# Patient Record
Sex: Male | Born: 1960 | Race: White | Hispanic: No | Marital: Married | Smoking: Never smoker
Health system: Southern US, Community
[De-identification: ages and names within clinical notes are randomized; demographics above are authoritative.]

---

## 2000-05-25 ENCOUNTER — Emergency Department (HOSPITAL_COMMUNITY): Admission: EM | Admit: 2000-05-25 | Discharge: 2000-05-25 | Payer: Self-pay

## 2006-04-22 ENCOUNTER — Encounter: Admission: RE | Admit: 2006-04-22 | Discharge: 2006-04-22 | Payer: Self-pay | Admitting: Orthopedic Surgery

## 2009-10-10 ENCOUNTER — Ambulatory Visit: Payer: Self-pay | Admitting: Professional

## 2009-12-28 ENCOUNTER — Ambulatory Visit: Payer: Self-pay | Admitting: Internal Medicine

## 2009-12-28 DIAGNOSIS — I10 Essential (primary) hypertension: Secondary | ICD-10-CM | POA: Insufficient documentation

## 2010-01-12 ENCOUNTER — Ambulatory Visit: Payer: Self-pay | Admitting: Pulmonary Disease

## 2010-01-12 DIAGNOSIS — R0609 Other forms of dyspnea: Secondary | ICD-10-CM | POA: Insufficient documentation

## 2010-01-12 DIAGNOSIS — R0989 Other specified symptoms and signs involving the circulatory and respiratory systems: Secondary | ICD-10-CM

## 2010-01-16 ENCOUNTER — Ambulatory Visit: Payer: Self-pay | Admitting: Pulmonary Disease

## 2010-01-17 DIAGNOSIS — G4733 Obstructive sleep apnea (adult) (pediatric): Secondary | ICD-10-CM | POA: Insufficient documentation

## 2010-01-24 ENCOUNTER — Ambulatory Visit: Payer: Self-pay | Admitting: Pulmonary Disease

## 2010-06-08 ENCOUNTER — Inpatient Hospital Stay (HOSPITAL_COMMUNITY): Admission: RE | Admit: 2010-06-08 | Discharge: 2010-06-09 | Payer: Self-pay | Admitting: *Deleted

## 2010-11-23 NOTE — Assessment & Plan Note (Signed)
Summary: home study shows AHI of 23/hr.   Copy to:  Dr. Sanda Linger Primary Provider/Referring Provider:  Etta Grandchild MD   History of Present Illness: Pt's apnea link device shows moderate osa with AHI of 23/hr, and desat as low as 81%.  There were no central apneas noted.  Evaluation period was 7hrs and .  I have reviewed the pt's raw data in detail, and agree with the scoring of events.  Allergies: No Known Drug Allergies   Impression & Recommendations:  Problem # 1:  OBSTRUCTIVE SLEEP APNEA (ICD-327.23)  the pt has moderate osa by his type 3 device for home sleep study.  Will arrange for f/u visit to discuss results, and also to arrange treatment.  Orders: Sleep Std Airflow/Heartrate and O2 SAT unattended ([id number])  Patient Instructions: 1)  will arrange ov to discuss sleep study results.

## 2010-11-23 NOTE — Assessment & Plan Note (Signed)
Summary: NEW/ [employer] FED/ [employer]WS  #   Vital Signs:  Patient profile:   50 year old male Height:      72 inches Weight:      194 pounds BMI:     26.41 O2 Sat:      95 % on Room air Temp:     97.0 degrees F oral Pulse rate:   90 / minute Pulse rhythm:   regular Resp:     16 per minute BP sitting:   130 / 82  (left arm) Cuff size:   large  Vitals Entered By: Bill Salinas CMA (December 28, 2009 1:15 PM)  Nutrition Counseling: Patient's BMI is greater than 25 and therefore counseled on weight management options.  O2 Flow:  Room air CC: New pt office visit for evaluation of high bp/ ab   Primary Care Provider:  Etta Grandchild MD  CC:  New pt office visit for evaluation of high bp/ ab.  History of Present Illness:  Hypertension Follow-Up      This is a 50 year old man who presents for Hypertension follow-up.  The patient denies lightheadedness, urinary frequency, headaches, edema, impotence, rash, and fatigue.  The patient denies the following associated symptoms: chest pain, chest pressure, exercise intolerance, dyspnea, palpitations, syncope, leg edema, and pedal edema.  Compliance with medications (by patient report) has been near 100%.  The patient reports that dietary compliance has been excellent.  The patient reports exercising daily.  Adjunctive measures currently used by the patient include salt restriction and relaxation.    His wife is with him today and she reports a 6 month hx. of heavy snoring and apnea spells that last up to 20 seconds.  Preventive Screening-Counseling & Management  Alcohol-Tobacco     Alcohol drinks/day: 0     Smoking Status: never  Caffeine-Diet-Exercise     Does Patient Exercise: no  Hep-HIV-STD-Contraception     Hepatitis Risk: no risk noted     HIV Risk: no risk noted     STD Risk: no risk noted      Sexual History:  currently monogamous.        Drug Use:  no.        Blood Transfusions:  no.    Current Medications (verified): 1)  Diovan  160 Mg Tabs (Valsartan) .Marland Kitchen.. 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   Resp:  Complains of excessive snoring; denies chest discomfort, chest pain with inspiration, cough, coughing up blood, hypersomnolence, morning headaches, pleuritic, shortness of breath, sputum productive, and wheezing.  Physical Exam  General:  alert, well-developed, well-nourished, well-hydrated, appropriate dress, normal appearance, healthy-appearing, and cooperative to examination.   Head:  normocephalic, atraumatic, no abnormalities observed, and no abnormalities palpated.   Eyes:  vision grossly intact, pupils equal, pupils round, and pupils reactive to light.  Mouth:  Oral mucosa and oropharynx without lesions or exudates.  Teeth in good repair. Neck:  supple, full ROM, no masses, no thyromegaly, no thyroid nodules or tenderness, no JVD, normal carotid upstroke, and no carotid bruits.   Lungs:  Normal respiratory effort, chest expands symmetrically. Lungs are clear to auscultation, no crackles or wheezes. Heart:  Normal rate and regular rhythm. S1 and S2 normal without gallop, murmur, click, rub or other extra sounds. Abdomen:  Bowel sounds positive,abdomen soft and non-tender without  masses, organomegaly or hernias noted. Msk:  No deformity or scoliosis noted of thoracic or lumbar spine.   Pulses:  R and L carotid,radial,femoral,dorsalis pedis and posterior tibial pulses are full and equal bilaterally Extremities:  No clubbing, cyanosis, edema, or deformity noted with normal full range of motion of all joints.   Neurologic:  No cranial nerve deficits noted. Station and gait are normal. Plantar reflexes are down-going bilaterally. DTRs are symmetrical throughout. Sensory, motor and coordinative functions appear intact. Skin:  Intact without suspicious lesions or rashes Cervical Nodes:  No lymphadenopathy noted Psych:  Cognition and judgment appear intact. Alert and cooperative with normal attention span and concentration. No apparent delusions, illusions, hallucinations Additional Exam:  EKG is normal.   Impression & Recommendations:  Problem # 1:  HYPERTENSION (ICD-401.9) Assessment Improved  His updated medication list for this problem includes:    Diovan 160 Mg Tabs (Valsartan) .Marland Kitchen... 1 qd  BP today: 130/82  Orders: EKG w/ Interpretation (93000)  Problem # 2:  SLEEP APNEA (ICD-780.57) Assessment: New  Orders: Sleep Disorder Referral (Sleep Disorder) EKG w/ Interpretation (93000)  Complete Medication List: 1)  Diovan 160 Mg Tabs (Valsartan) .Marland Kitchen.. 1 qd  Patient Instructions: 1)  Please schedule a follow-up appointment in 2 months. 2)  It is important that you exercise regularly at least 20 minutes 5 times a week. If you develop chest pain, have severe difficulty breathing, or feel very tired , stop exercising immediately and seek medical attention. 3)  You need to lose weight. Consider a lower calorie diet and regular exercise.  4)  Check your Blood Pressure regularly. If it is above 140/90: you should make an appointment.

## 2010-11-23 NOTE — Assessment & Plan Note (Signed)
Summary: consult for osa   Visit Type:  Initial Consult Copy to:  Dr. Sanda Linger Primary Provider/Referring Provider:  Etta Grandchild MD  CC:  Sleep Consultation for possible OSA.Marland Kitchen  History of Present Illness: the pt is a 50y/o male who I have been asked to see for possible osa.  He has been noted to have loud snoring, pauses in his breathing during sleep, but denies any choking arousals.  He goes to bed around 10pm, and arises at 6am to start his day.  He is unrested about 60% of the time.  He does have variable numbers of awakenings at night.  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  He has no problem staying awake at night watching tv or movie.  He denies any sleepiness driving.  His weight is neutral over the past 2 years, and his epworth score is 3.  Current Medications (verified): 1)  Diovan 160 Mg Tabs (Valsartan) .Marland Kitchen.. 1 Qd  Allergies (verified): No Known Drug Allergies  Past History:  Past Medical History: Reviewed history from 12/28/2009 and no changes required. Hypertension  Past Surgical History: Reviewed history from 12/28/2009 and no changes required. Denies surgical history  Family History: Reviewed history from 12/28/2009 and no changes required. Family History Hypertension Family History Lung cancer  Social History: Reviewed history from 12/28/2009 and no changes required. 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    Vital Signs:  Patient profile:   50 year old male Weight:      196 pounds O2 Sat:      95 % on Room air Temp:     97.9 degrees F oral Pulse rate:   98 / minute BP sitting:   108 / 70  (left arm)  Vitals Entered By: Vernie Murders (January 12, 2010 2:37 PM)  O2 Flow:  Room air  Physical Exam  General:  wd male in nad Eyes:  PERRLA and EOMI.   Nose:  mild septal deviation to right, but patent. Mouth:  mild elongation of soft palate, long uvula Neck:  no jvd, tmg, LN Lungs:  totally clear to auscultation Heart:  rrr, no mrg. Abdomen:  soft and nontender, bs+ Extremities:  no edema noted, no cyanosis pulses intact distally. Neurologic:  alert and oriented, moves all 4.   Impression & Recommendations:  Problem # 1:  SNORING (ICD-786.09) the pt's history is most c/w symptomatic snoring, but I certainly cannot exclude osa.  He has nonrestorative sleep more than 50% of the time, but denies any significant daytime sleepiness issues.  My suspicion is low for clinically signficant sleep apnea, but will do home sleep study to put the issue to rest.  He is agreeable to this approach.  If he does not have osa, I have discussed with him various treatments for snoring including weight loss, positional therapy, surgery, and dental appliance.  Will arrange f/u once sleep study results are available.  Other Orders: Consultation Level  IV [id number]) Misc. Referral (Misc. Ref)  Patient Instructions: 1)  will check apnea link to screen for sleep apnea 2)  work on weight loss, stay off back while sleeping (after you do the apnea link). 3)  will call with results of study.

## 2010-11-23 NOTE — Assessment & Plan Note (Signed)
Summary: rov to review sleep study   Copy to:  Dr. Sanda Linger Primary Provider/Referring Provider:  Etta Grandchild MD  CC:  Follow-up on apnea link..  History of Present Illness: the pt comes in today for f/u after his recent home sleep study.  He was found to have moderate osa, with AHI 23/hr and desat to 81%.  I have gone over the study in depth with the pt and his wife, and answered all of their questions.  Allergies (verified): No Known Drug Allergies  Vital Signs:  Patient profile:   50 year old male Height:      72 inches (182.88 cm) Weight:      198 pounds (90.00 kg) BMI:     26.95 O2 Sat:      96 % on Room air Temp:     98.6 degrees F (37.00 degrees C) oral Pulse rate:   120 / minute BP sitting:   108 / 68  (left arm) Cuff size:   regular  Vitals Entered By: Michel Bickers CMA (January 24, 2010 12:14 PM)  O2 Sat at Rest %:  96 O2 Flow:  Room air  Physical Exam  General:  wd male in nad   Impression & Recommendations:  Problem # 1:  OBSTRUCTIVE SLEEP APNEA (ICD-327.23) the pt has moderate osa with definite impact on QOL.  I have reviewed the various treatment options, including modest weight loss, ua surgery, dental appliance, and cpap.  We have discussed the advantages and disadvantages of the different treatments, and he would like to take some time to think about this.  Time spent with pt today was .  Other Orders: Est. Patient Level III (16109)  Patient Instructions: 1)  think about various treatment options we discussed.  Please call me.

## 2011-01-05 LAB — ABO/RH: ABO/RH(D): O POS

## 2011-01-05 LAB — CBC
HCT: 46.1 % (ref 39.0–52.0)
Hemoglobin: 16.3 g/dL (ref 13.0–17.0)
MCH: 31.8 pg (ref 26.0–34.0)
MCHC: 35.4 g/dL (ref 30.0–36.0)
Platelets: 232 10*3/uL (ref 150–400)
RDW: 12.2 % (ref 11.5–15.5)

## 2011-01-05 LAB — COMPREHENSIVE METABOLIC PANEL
AST: 29 U/L (ref 0–37)
Albumin: 4.3 g/dL (ref 3.5–5.2)
Alkaline Phosphatase: 62 U/L (ref 39–117)
BUN: 12 mg/dL (ref 6–23)
CO2: 27 mEq/L (ref 19–32)
Calcium: 9.8 mg/dL (ref 8.4–10.5)
Chloride: 105 mEq/L (ref 96–112)
GFR calc Af Amer: >60 mL/min (ref >60)
GFR calc non Af Amer: >60 mL/min (ref >60)
Potassium: 4.2 mEq/L (ref 3.5–5.1)
Sodium: 141 mEq/L (ref 135–145)
Total Bilirubin: 2.2 mg/dL — ABNORMAL HIGH (ref 0.3–1.2)
Total Protein: 7.4 g/dL (ref 6.0–8.3)

## 2011-01-05 LAB — DIFFERENTIAL
Basophils Absolute: 0 10*3/uL (ref 0.0–0.1)
Lymphs Abs: 1.5 10*3/uL (ref 0.7–4.0)
Neutro Abs: 5.3 10*3/uL (ref 1.7–7.7)

## 2011-01-05 LAB — URINALYSIS, ROUTINE W REFLEX MICROSCOPIC
Bilirubin Urine: NEGATIVE
Nitrite: NEGATIVE
Protein, ur: NEGATIVE mg/dL
Urobilinogen, UA: 0.2 mg/dL (ref 0.0–1.0)

## 2011-01-05 LAB — TYPE AND SCREEN: Antibody Screen: NEGATIVE

## 2011-01-05 LAB — PROTIME-INR: INR: 0.93 (ref 0.00–1.49)

## 2011-03-10 IMAGING — RF DG CERVICAL SPINE 2 OR 3 VIEWS
1 series · 2 of 2 positions shown · non-contrast
Comparison: None.

Addendum Begins

Addendum Ends
CLINICAL DATA: C5-C7 ACDF.
Fluoroscopy time:  8 seconds.
CERVICAL SPINE - 2-3 VIEW

[Series 1: run · 2 of 2 slices shown]
[im 1/2]
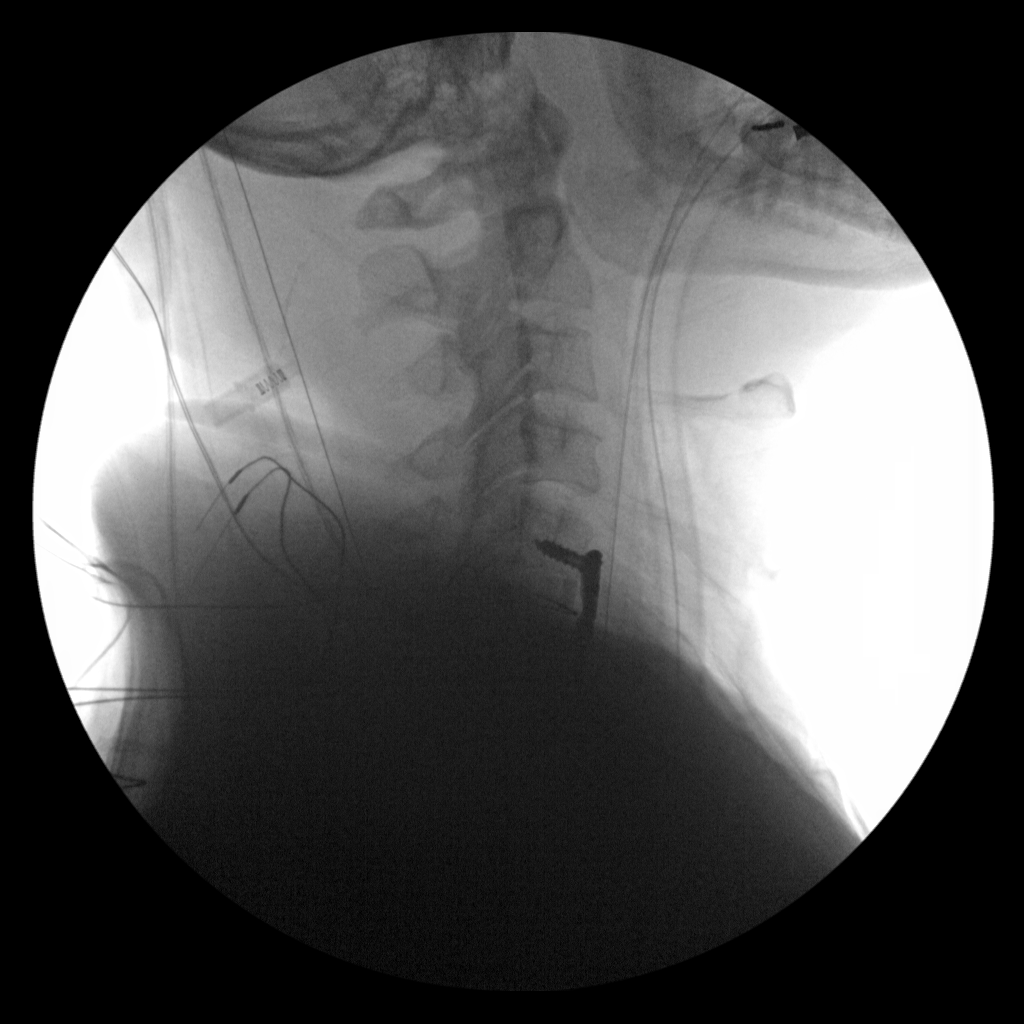
[im 2/2]
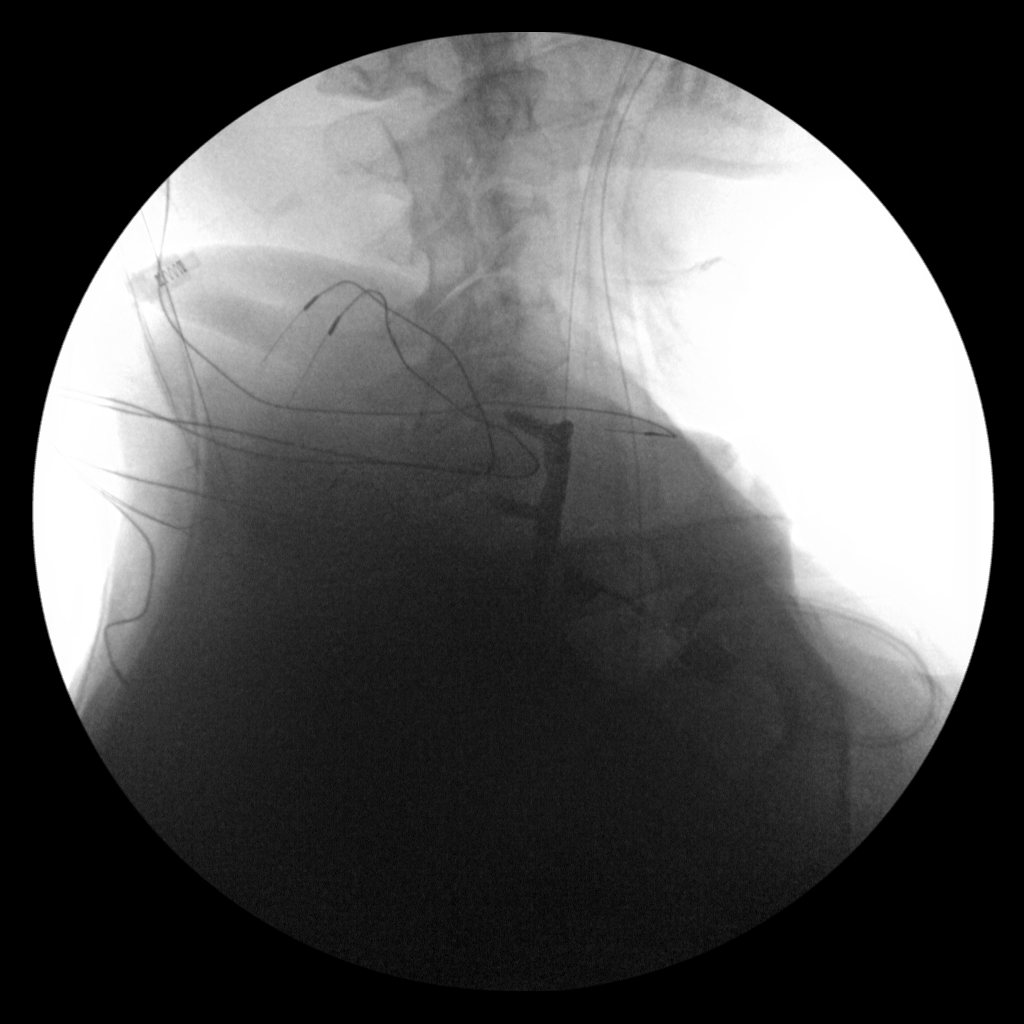

[2 of 2 positions shown; findings below may reference images not displayed]

FINDINGS: Shoulder soft tissues obscure the C6-7.  More superior
anterior cervical discectomy fusion hardware and screws with
interbody bone plug C5-6 appears normal.
IMPRESSION: 1.  Visualized portion C5-6 portion of ACDF appears normal.
2.  C6-7 obscured due to shoulder soft tissues.

## 2012-05-02 ENCOUNTER — Ambulatory Visit (INDEPENDENT_AMBULATORY_CARE_PROVIDER_SITE_OTHER): Payer: BC Managed Care – PPO | Admitting: Emergency Medicine

## 2012-05-02 VITALS — BP 138/90 | HR 104 | Temp 98.0°F | Resp 16 | Ht 70.58 in | Wt 196.2 lb

## 2012-05-02 DIAGNOSIS — Z202 Contact with and (suspected) exposure to infections with a predominantly sexual mode of transmission: Secondary | ICD-10-CM

## 2012-05-02 DIAGNOSIS — R300 Dysuria: Secondary | ICD-10-CM

## 2012-05-02 DIAGNOSIS — Z2089 Contact with and (suspected) exposure to other communicable diseases: Secondary | ICD-10-CM

## 2012-05-02 LAB — POCT URINALYSIS DIPSTICK
Bilirubin, UA: NEGATIVE
Blood, UA: NEGATIVE
Glucose, UA: NEGATIVE
Ketones, UA: NEGATIVE
Leukocytes, UA: NEGATIVE
Nitrite, UA: NEGATIVE
Protein, UA: NEGATIVE
Spec Grav, UA: 1.015
Urobilinogen, UA: 0.2
pH, UA: 6.5

## 2012-05-02 LAB — POCT UA - MICROSCOPIC ONLY
Bacteria, U Microscopic: NEGATIVE
Casts, Ur, LPF, POC: NEGATIVE
Epithelial cells, urine per micros: NEGATIVE
Mucus, UA: NEGATIVE
RBC, urine, microscopic: NEGATIVE
Yeast, UA: NEGATIVE

## 2012-05-02 MED ORDER — DOXYCYCLINE HYCLATE 100 MG PO CAPS: 100.0000 mg | ORAL_CAPSULE | Freq: Two times a day (BID) | ORAL | Status: AC

## 2012-05-02 MED ORDER — CEFTRIAXONE SODIUM 1 G IJ SOLR
250.0000 mg | INTRAMUSCULAR | Status: DC
  Administered 2012-05-02: 250 mg via INTRAMUSCULAR

## 2012-05-02 NOTE — Patient Instructions (Signed)
Sexually Transmitted Disease Sexually transmitted disease (STD) refers to any infection that is passed from person to person during sexual activity. This may happen by way of saliva, semen, blood, vaginal mucus, or urine. Common STDs include:  Gonorrhea.   Chlamydia.   Syphilis.   HIV/AIDS.   Genital herpes.   Hepatitis B and C.   Trichomonas.   Human papillomavirus (HPV).   Pubic lice.  CAUSES  An STD may be spread by bacteria, virus, or parasite. A person can get an STD by:  Sexual intercourse with an infected person.   Sharing sex toys with an infected person.   Sharing needles with an infected person.   Having intimate contact with the genitals, mouth, or rectal areas of an infected person.  SYMPTOMS  Some people may not have any symptoms, but they can still pass the infection to others. Different STDs have different symptoms. Symptoms include:  Painful or bloody urination.   Pain in the pelvis, abdomen, vagina, anus, throat, or eyes.   Skin rash, itching, irritation, growths, or sores (lesions). These usually occur in the genital or anal area.   Abnormal vaginal discharge.   Penile discharge in men.   Soft, flesh-colored skin growths in the genital or anal area.   Fever.   Pain or bleeding during sexual intercourse.   Swollen glands in the groin area.   Yellow skin and eyes (jaundice). This is seen with hepatitis.  DIAGNOSIS  To make a diagnosis, your caregiver may:  Take a medical history.   Perform a physical exam.   Take a specimen (culture) to be examined.   Examine a sample of discharge under a microscope.   Perform blood tests.   Perform a Pap test, if this applies.   Perform a colposcopy.   Perform a laparoscopy.  TREATMENT   Chlamydia, gonorrhea, trichomonas, and syphilis can be cured with antibiotic medicine.   Genital herpes, hepatitis, and HIV can be treated, but not cured, with prescribed medicines. The medicines will lessen  the symptoms.   Genital warts from HPV can be treated with medicine or by freezing, burning (electrocautery), or surgery. Warts may come back.   HPV is a virus and cannot be cured with medicine or surgery.However, abnormal areas may be followed very closely by your caregiver and may be removed from the cervix, vagina, or vulva through office procedures or surgery.  If your diagnosis is confirmed, your recent sexual partners need treatment. This is true even if they are symptom-free or have a negative culture or evaluation. They should not have sex until their caregiver says it is okay. HOME CARE INSTRUCTIONS  All sexual partners should be informed, tested, and treated for all STDs.   Take your antibiotics as directed. Finish them even if you start to feel better.   Only take over-the-counter or prescription medicines for pain, discomfort, or fever as directed by your caregiver.   Rest.   Eat a balanced diet and drink enough fluids to keep your urine clear or pale yellow.   Do not have sex until treatment is completed and you have followed up with your caregiver. STDs should be checked after treatment.   Keep all follow-up appointments, Pap tests, and blood tests as directed by your caregiver.   Only use latex condoms and water-soluble lubricants during sexual activity. Do not use petroleum jelly or oils.   Avoid alcohol and illegal drugs.   Get vaccinated for HPV and hepatitis. If you have not received these vaccines   in the past, talk to your caregiver about whether one or both might be right for you.   Avoid risky sex practices that can break the skin.  The only way to avoid getting an STD is to avoid all sexual activity.Latex condoms and dental dams (for oral sex) will help lessen the risk of getting an STD, but will not completely eliminate the risk. SEEK MEDICAL CARE IF:   You have a fever.   You have any new or worsening symptoms.  Document Released: 12/29/2002 Document  Revised: 09/27/2011 Document Reviewed: 01/05/2011 [facility] Patient Information 2012 [place], [place].

## 2012-05-02 NOTE — Progress Notes (Signed)
  Subjective:    Patient ID: Jason Harms., male    DOB: 27-Jul-1961, 51 y.o.   MRN: [id number]  HPI Comments: Had unprotected sexual encounter  Dysuria  This is a new problem. The current episode started in the past 7 days. The problem occurs every urination. The problem has been unchanged. The quality of the pain is described as burning. The pain is mild. There has been no fever. He is sexually active. There is no history of pyelonephritis. Associated symptoms include a discharge. Pertinent negatives include no chills, flank pain, frequency, hematuria, hesitancy, nausea, possible pregnancy, sweats, urgency or vomiting. He has tried nothing for the symptoms. The treatment provided no relief. There is no history of catheterization, kidney stones, recurrent UTIs, a single kidney, urinary stasis or a urological procedure.      Review of Systems  Constitutional: Negative for chills.  Gastrointestinal: Negative for nausea and vomiting.  Genitourinary: Positive for dysuria. Negative for hesitancy, urgency, frequency, hematuria and flank pain.  All other systems reviewed and are negative.       Objective:   Physical Exam  Constitutional: He is oriented to person, place, and time. He appears well-developed and well-nourished. No distress.  HENT:  Head: Normocephalic and atraumatic.  Right Ear: External ear normal.  Eyes: Conjunctivae are normal. Pupils are equal, round, and reactive to light. No scleral icterus.  Neck: Normal range of motion.  Cardiovascular: Normal rate.   Pulmonary/Chest: Effort normal.  Genitourinary: Penis normal. No penile tenderness.  Musculoskeletal: Normal range of motion.  Neurological: He is alert and oriented to person, place, and time.  Skin: Skin is warm and dry.          Assessment & Plan:  Exposure to STD genprobe Rocephin Doxy

## 2012-05-03 LAB — GC/CHLAMYDIA PROBE AMP, GENITAL: GC Probe Amp, Genital: NEGATIVE

## 2015-07-19 ENCOUNTER — Emergency Department (INDEPENDENT_AMBULATORY_CARE_PROVIDER_SITE_OTHER)
Admission: EM | Admit: 2015-07-19 | Discharge: 2015-07-19 | Disposition: A | Discharge status: Home / Self Care | Payer: Worker's Compensation | Source: Home / Self Care | Attending: Emergency Medicine | Admitting: Emergency Medicine

## 2015-07-19 ENCOUNTER — Emergency Department (INDEPENDENT_AMBULATORY_CARE_PROVIDER_SITE_OTHER): Payer: Worker's Compensation

## 2015-07-19 ENCOUNTER — Encounter (HOSPITAL_COMMUNITY): Payer: Self-pay | Admitting: Emergency Medicine

## 2015-07-19 DIAGNOSIS — S161XXA Strain of muscle, fascia and tendon at neck level, initial encounter: Secondary | ICD-10-CM

## 2015-07-19 DIAGNOSIS — S39012A Strain of muscle, fascia and tendon of lower back, initial encounter: Secondary | ICD-10-CM

## 2015-07-19 DIAGNOSIS — T148 Other injury of unspecified body region: Secondary | ICD-10-CM

## 2015-07-19 DIAGNOSIS — T148XXA Other injury of unspecified body region, initial encounter: Secondary | ICD-10-CM

## 2015-07-19 MED ORDER — DICLOFENAC SODIUM 1 % TD GEL: 1.0000 "application " | Freq: Four times a day (QID) | TRANSDERMAL | Status: DC

## 2015-07-19 MED ORDER — NAPROXEN 375 MG PO TABS: 375.0000 mg | ORAL_TABLET | Freq: Two times a day (BID) | ORAL | Status: DC

## 2015-07-19 MED ORDER — CYCLOBENZAPRINE HCL 5 MG PO TABS: ORAL_TABLET | ORAL | Status: DC
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
## 2015-07-19 NOTE — Discharge Instructions (Signed)
Back Pain, Adult °Low back pain is very common. About 1 in 5 people have back pain. The cause of low back pain is rarely dangerous. The pain often gets better over time. About half of people with a sudden onset of back pain feel better in just 2 weeks. About 8 in 10 people feel better by 6 weeks.  °CAUSES °Some common causes of back pain include: °· Strain of the muscles or ligaments supporting the spine. °· Wear and tear (degeneration) of the spinal discs. °· Arthritis. °· Direct injury to the back. °DIAGNOSIS °Most of the time, the direct cause of low back pain is not known. However, back pain can be treated effectively even when the exact cause of the pain is unknown. Answering your caregiver's questions about your overall health and symptoms is one of the most accurate ways to make sure the cause of your pain is not dangerous. If your caregiver needs more information, he or she may order lab work or imaging tests (X-rays or MRIs). However, even if imaging tests show changes in your back, this usually does not require surgery. °HOME CARE INSTRUCTIONS °For many people, back pain returns. Since low back pain is rarely dangerous, it is often a condition that people can learn to manage on their own.  °· Remain active. It is stressful on the back to sit or stand in one place. Do not sit, drive, or stand in one place for more than 30 minutes at a time. Take short walks on level surfaces as soon as pain allows. Try to increase the length of time you walk each day. °· Do not stay in bed. Resting more than 1 or 2 days can delay your recovery. °· Do not avoid exercise or work. Your body is made to move. It is not dangerous to be active, even though your back may hurt. Your back will likely heal faster if you return to being active before your pain is gone. °· Pay attention to your body when you  bend and lift. Many people have less discomfort when lifting if they bend their knees, keep the load close to their bodies, and  avoid twisting. Often, the most comfortable positions are those that put less stress on your recovering back. °· Find a comfortable position to sleep. Use a firm mattress and lie on your side with your knees slightly bent. If you lie on your back, put a pillow under your knees. °· Only take over-the-counter or prescription medicines as directed by your caregiver. Over-the-counter medicines to reduce pain and inflammation are often the most helpful. Your caregiver may prescribe muscle relaxant drugs. These medicines help dull your pain so you can more quickly return to your normal activities and healthy exercise. °· Put ice on the injured area. °¨ Put ice in a plastic bag. °¨ Place a towel between your skin and the bag. °¨ Leave the ice on for 15-20 minutes, 03-04 times a day for the first 2 to 3 days. After that, ice and heat may be alternated to reduce pain and spasms. °· Ask your caregiver about trying back exercises and gentle massage. This may be of some benefit. °· Avoid feeling anxious or stressed. Stress increases muscle tension and can worsen back pain. It is important to recognize when you are anxious or stressed and learn ways to manage it. Exercise is a great option. °SEEK MEDICAL CARE IF: °· You have pain that is not relieved with rest or medicine. °· You have pain that does not improve in 1 week. °· You have new symptoms. °· You are generally not feeling well. °SEEK   IMMEDIATE MEDICAL CARE IF:  °· You have pain that radiates from your back into your legs. °· You develop new bowel or bladder control problems. °· You have unusual weakness or numbness in your arms or legs. °· You develop nausea or vomiting. °· You develop abdominal pain. °· You feel faint. °Document Released: 10/08/2005 Document Revised: 04/08/2012 Document Reviewed: 02/09/2014 °[employer]® Patient Information ©2015 [employer]. This information is not intended to replace advice given to you by your health care provider. Make sure you  discuss any questions you have with your health care provider. ° °Lumbosacral Strain °Lumbosacral strain is a strain of any of the parts that make up your lumbosacral vertebrae. Your lumbosacral vertebrae are the bones that make up the lower third of your backbone. Your lumbosacral vertebrae are held together by muscles and tough, fibrous tissue (ligaments).  °CAUSES  °A sudden blow to your back can cause lumbosacral strain. Also, anything that causes an excessive stretch of the muscles in the low back can cause this strain. This is typically seen when people exert themselves strenuously, fall, lift heavy objects, bend, or crouch repeatedly. °RISK FACTORS °· Physically demanding work. °· Participation in pushing or pulling sports or sports that require a sudden twist of the back (tennis, golf, baseball). °· Weight lifting. °· Excessive lower back curvature. °· Forward-tilted pelvis. °· Weak back or abdominal muscles or both. °· Tight hamstrings. °SIGNS AND SYMPTOMS  °Lumbosacral strain may cause pain in the area of your injury or pain that moves (radiates) down your leg.  °DIAGNOSIS °Your health care provider can often diagnose lumbosacral strain through a physical exam. In some cases, you may need tests such as X-ray exams.  °TREATMENT  °Treatment for your lower back injury depends on many factors that your clinician will have to evaluate. However, most treatment will include the use of anti-inflammatory medicines. °HOME CARE INSTRUCTIONS  °· Avoid hard physical activities (tennis, racquetball, waterskiing) if you are not in proper physical condition for it. This may aggravate or create problems. °· If you have a back problem, avoid sports requiring sudden body movements. Swimming and walking are generally safer activities. °· Maintain good posture. °· Maintain a healthy weight. °· For acute conditions, you may put ice on the injured area. °¨ Put ice in a plastic bag. °¨ Place a towel between your skin and the  bag. °¨ Leave the ice on for 20 minutes, 2-3 times a day. °· When the low back starts healing, stretching and strengthening exercises may be recommended. °SEEK MEDICAL CARE IF: °· Your back pain is getting worse. °· You experience severe back pain not relieved with medicines. °SEEK IMMEDIATE MEDICAL CARE IF:  °· You have numbness, tingling, weakness, or problems with the use of your arms or legs. °· There is a change in bowel or bladder control. °· You have increasing pain in any area of the body, including your belly (abdomen). °· You notice shortness of breath, dizziness, or feel faint. °· You feel sick to your stomach (nauseous), are throwing up (vomiting), or become sweaty. °· You notice discoloration of your toes or legs, or your feet get very cold. °MAKE SURE YOU:  °· Understand these instructions. °· Will watch your condition. °· Will get help right away if you are not doing well or get worse. °Document Released: 07/18/2005 Document Revised: 10/13/2013 Document Reviewed: 05/27/2013 °ExitCare® Patient Information ©2015 [employer]. This information is not intended to replace advice given to you by your health care provider.   Make sure you discuss any questions you have with your health care provider. ° °Muscle Strain °A muscle strain is an injury that occurs when a muscle is stretched beyond its normal length. Usually a small number of muscle fibers are torn when this happens. Muscle strain is rated in degrees. First-degree strains have the least amount of muscle fiber tearing and pain. Second-degree and third-degree strains have increasingly more tearing and pain.  °Usually, recovery from muscle strain takes 1-2 weeks. Complete healing takes 5-6 weeks.  °CAUSES  °Muscle strain happens when a sudden, violent force placed on a muscle stretches it too far. This may occur with lifting, sports, or a fall.  °RISK FACTORS °Muscle strain is especially common in athletes.  °SIGNS AND SYMPTOMS °At the site of the  muscle strain, there may be: °· Pain. °· Bruising. °· Swelling. °· Difficulty using the muscle due to pain or lack of normal function. °DIAGNOSIS  °Your health care provider will perform a physical exam and ask about your medical history. °TREATMENT  °Often, the best treatment for a muscle strain is resting, icing, and applying cold compresses to the injured area.   °HOME CARE INSTRUCTIONS  °· Use the PRICE method of treatment to promote muscle healing during the first 2-3 days after your injury. The PRICE method involves: °¨ Protecting the muscle from being injured again. °¨ Restricting your activity and resting the injured body part. °¨ Icing your injury. To do this, put ice in a plastic bag. Place a towel between your skin and the bag. Then, apply the ice and leave it on from 15-20 minutes each hour. After the third day, switch to moist heat packs. °¨ Apply compression to the injured area with a splint or elastic bandage. Be careful not to wrap it too tightly. This may interfere with blood circulation or increase swelling. °¨ Elevate the injured body part above the level of your heart as often as you can. °· Only take over-the-counter or prescription medicines for pain, discomfort, or fever as directed by your health care provider. °· Warming up prior to exercise helps to prevent future muscle strains. °SEEK MEDICAL CARE IF:  °· You have increasing pain or swelling in the injured area. °· You have numbness, tingling, or a significant loss of strength in the injured area. °MAKE SURE YOU:  °· Understand these instructions. °· Will watch your condition. °· Will get help right away if you are not doing well or get worse. °Document Released: 10/08/2005 Document Revised: 07/29/2013 Document Reviewed: 05/07/2013 °ExitCare® Patient Information ©2015 [employer]. This information is not intended to replace advice given to you by your health care provider. Make sure you discuss any questions you have with your health  care provider. ° °

## 2022-04-22 ENCOUNTER — Emergency Department (HOSPITAL_BASED_OUTPATIENT_CLINIC_OR_DEPARTMENT_OTHER)
Admission: EM | Admit: 2022-04-22 | Discharge: 2022-04-22 | Disposition: A | Discharge status: Home / Self Care | Payer: Federal, State, Local not specified - PPO | Attending: Emergency Medicine | Admitting: Emergency Medicine

## 2022-04-22 ENCOUNTER — Emergency Department (HOSPITAL_BASED_OUTPATIENT_CLINIC_OR_DEPARTMENT_OTHER): Payer: Federal, State, Local not specified - PPO

## 2022-04-22 ENCOUNTER — Other Ambulatory Visit: Payer: Self-pay

## 2022-04-22 ENCOUNTER — Encounter (HOSPITAL_BASED_OUTPATIENT_CLINIC_OR_DEPARTMENT_OTHER): Payer: Self-pay | Admitting: Emergency Medicine

## 2022-04-22 DIAGNOSIS — M79671 Pain in right foot: Secondary | ICD-10-CM | POA: Diagnosis present

## 2022-04-22 DIAGNOSIS — Z7982 Long term (current) use of aspirin: Secondary | ICD-10-CM | POA: Insufficient documentation

## 2022-04-22 MED ORDER — MELOXICAM 7.5 MG PO TABS: 7.5000 mg | ORAL_TABLET | Freq: Every day | ORAL | 0 refills | Status: DC

## 2022-04-22 MED ORDER — DICLOFENAC SODIUM 1 % TD GEL: 2.0000 g | Freq: Four times a day (QID) | TRANSDERMAL | 0 refills | Status: AC

## 2022-04-22 NOTE — ED Provider Notes (Signed)
[facility] EMERGENCY DEPARTMENT Provider Note   CSN: [id number] Arrival date & time: 04/22/22  6387     History  Chief Complaint  Patient presents with   Foot Pain    Jason Shepard. is a 61 y.o. male.  Patient is a 61 year old male who presents with pain in his right foot.  He says been hurting about 4 days.  It hurts more when he walks on it.  He denies any known injury.  No change in shoes or increased activity.  No prior history of joint issues.  He has been intermittently taking some aspirin and ibuprofen with no significant improvement in symptoms.  He has not noticed any swelling.       Home Medications Prior to Admission medications   Medication Sig Start Date End Date Taking? Authorizing Provider  meloxicam (MOBIC) 7.5 MG tablet Take 1 tablet (7.5 mg total) by mouth daily. 04/22/22  Yes Rolan Bucco, MD  cyclobenzaprine (FLEXERIL) 5 MG tablet One tablet every 8 hours when at home for muscle spasms. Will cause drowsiness 07/19/15   Hayden Rasmussen, NP  diclofenac sodium (VOLTAREN) 1 % GEL Apply 2 g topically 4 (four) times daily. 04/22/22   Rolan Bucco, MD      Allergies    Patient has no known allergies.    Review of Systems   Review of Systems  Constitutional:  Negative for fever.  Gastrointestinal:  Negative for nausea and vomiting.  Musculoskeletal:  Positive for arthralgias. Negative for back pain, joint swelling and neck pain.  Skin:  Negative for wound.  Neurological:  Negative for weakness, numbness and headaches.    Physical Exam Updated Vital Signs BP (!) 156/103 (BP Location: Left Arm)   Pulse 88   Temp 98.5 F (36.9 C) (Oral)   Resp 18   Ht 5\' 11"  (1.803 m)   Wt 90.7 kg   SpO2 94%   BMI 27.89 kg/m  Physical Exam Constitutional:      Appearance: He is well-developed.  HENT:     Head: Normocephalic and atraumatic.  Cardiovascular:     Rate and Rhythm: Normal rate.  Pulmonary:     Effort: Pulmonary effort is normal.   Musculoskeletal:        General: Tenderness present.     Cervical back: Normal range of motion and neck supple.     Comments: Patient has some tenderness over the midfoot area.  There is no swelling or deformity.  No warmth or erythema.  No wounds.  Pedal pulses are intact.  No pain to the ankle.  He has normal sensation and motor function distally.  Skin:    General: Skin is warm and dry.  Neurological:     Mental Status: He is alert and oriented to person, place, and time.     ED Results / Procedures / Treatments   Labs (all labs ordered are listed, but only abnormal results are displayed) Labs Reviewed - No data to display  EKG None  Radiology DG Foot Complete Right  Result Date: 04/22/2022 CLINICAL DATA:  RIGHT foot pain for 4 days, denies injury EXAM: RIGHT FOOT COMPLETE - 3+ VIEW COMPARISON:  None FINDINGS: Osseous mineralization low normal. Joint spaces preserved. No acute fracture or dislocation. Questionable erosion at the lateral margin of the fifth metatarsal head. Small plantar calcaneal spur. Small calcified bodies posterior to the ankle joint. IMPRESSION: Questionable erosion at fifth metatarsal head, cannot exclude an inflammatory arthropathy. Small plantar calcaneal spur. No acute  fracture or dislocation. Electronically Signed   By: Ulyses Southward M.D.   On: 04/22/2022 10:19    Procedures Procedures    Medications Ordered in ED Medications - No data to display  ED Course/ Medical Decision Making/ A&P                           Medical Decision Making Amount and/or Complexity of Data Reviewed Radiology: ordered.  Risk Prescription drug management.   Patient is a 61 year old who presents with pain in his right foot.  There is no visible swelling of the foot.  No signs of infection.  No joint swelling which would be more concerning for etiology such as gout or septic joint.  X-rays were performed which shows no evidence of fracture.  This was interpreted by me.   There is possible erosive changes in the fifth metatarsal.  This was confirmed by radiology.  Patient is otherwise well-appearing.  We will refer him to podiatry.  We will give him a prescription for Mobic and Voltaren gel.  Return precautions were given.  Final Clinical Impression(s) / ED Diagnoses Final diagnoses:  Foot pain, right    Rx / DC Orders ED Discharge Orders          Ordered    diclofenac sodium (VOLTAREN) 1 % GEL  4 times daily        04/22/22 1035    meloxicam (MOBIC) 7.5 MG tablet  Daily        04/22/22 1035              Rolan Bucco, MD 04/22/22 1039

## 2022-04-22 NOTE — ED Triage Notes (Signed)
Pt arrives pov, steady gait, c/o right foot pain x 4 days. Denies injury

## 2022-06-08 ENCOUNTER — Ambulatory Visit (INDEPENDENT_AMBULATORY_CARE_PROVIDER_SITE_OTHER): Payer: Federal, State, Local not specified - PPO

## 2022-06-08 ENCOUNTER — Ambulatory Visit (INDEPENDENT_AMBULATORY_CARE_PROVIDER_SITE_OTHER): Payer: Federal, State, Local not specified - PPO | Admitting: Podiatry

## 2022-06-08 ENCOUNTER — Encounter: Payer: Self-pay | Admitting: Podiatry

## 2022-06-08 DIAGNOSIS — R200 Anesthesia of skin: Secondary | ICD-10-CM | POA: Diagnosis not present

## 2022-06-08 DIAGNOSIS — R202 Paresthesia of skin: Secondary | ICD-10-CM

## 2022-06-08 DIAGNOSIS — M79671 Pain in right foot: Secondary | ICD-10-CM | POA: Diagnosis not present

## 2022-06-08 DIAGNOSIS — Q666 Other congenital valgus deformities of feet: Secondary | ICD-10-CM

## 2022-06-08 NOTE — Progress Notes (Signed)
Subjective:  Patient ID: Jason Shepard., male    DOB: [birth year],  MRN: [id number]  Chief Complaint  Patient presents with   Foot Pain    61 y.o. male presents with the above complaint.  Patient presents with primary complaint of right plantar foot numbness tingling burning as well as being flat-footed.  Patient states that this has been there for quite some time this point is gotten worse.  He is to get history of lower back pain.  He has not seen an Ortho to see me hurts with ambulation or pressure.  He does not wear any orthotics he works a lot on his foot.  His pain has improved but he still get numbness tingling the meloxicam is helped.  He just wanted make sure it is broken   Review of Systems: Negative except as noted in the HPI. Denies N/V/F/Ch.  History reviewed. No pertinent past medical history.  Current Outpatient Medications:    cyclobenzaprine (FLEXERIL) 5 MG tablet, One tablet every 8 hours when at home for muscle spasms. Will cause drowsiness, Disp: 20 tablet, Rfl: 0   diclofenac sodium (VOLTAREN) 1 % GEL, Apply 2 g topically 4 (four) times daily., Disp: 100 g, Rfl: 0   meloxicam (MOBIC) 7.5 MG tablet, Take 1 tablet (7.5 mg total) by mouth daily., Disp: 20 tablet, Rfl: 0  Current Facility-Administered Medications:    cefTRIAXone (ROCEPHIN) injection 250 mg, 250 mg, Intramuscular, Q24H, Carmelina Dane, MD, 250 mg at 05/02/12 1122  Social History   Tobacco Use  Smoking Status Never  Smokeless Tobacco Not on file    No Known Allergies Objective:  There were no vitals filed for this visit. There is no height or weight on file to calculate BMI. Constitutional Well developed. Well nourished.  Vascular Dorsalis pedis pulses palpable bilaterally. Posterior tibial pulses palpable bilaterally. Capillary refill normal to all digits.  No cyanosis or clubbing noted. Pedal hair growth normal.  Neurologic Normal speech. Oriented to person, place, and  time. Epicritic sensation to light touch grossly present bilaterally.  Dermatologic Nails well groomed and normal in appearance. No open wounds. No skin lesions.  Orthopedic: Generalized numbness tingling noted to the right lower extremity negative Tinel's sign noted.  No tarsal tunnel syndrome or common peroneal nerve compression syndrome.  History of lower back pain.  Gait examination shows pes planovalgus.  There was no neurologic imaging to assess partial ability for the larger dorsiflexion of the hallux.  Unable to perform single and double heel rise.   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  No fractures noted no other bony abnormalities identified.  Plantar and posterior heel spurring noted Assessment:   1. Pes planovalgus   2. Numbness and tingling    Plan:  Patient was evaluated and treated and all questions answered.  Right numbness tingling likely due to history of low back pain -I explained patient the etiology of numbness Regimen options were discussed.  I discussed with him that he will benefit from getting a spine specialist to check on his lower back which could likely be the etiology of the numbness tingling to the right foot.  He states understanding will do so immediately.  Pes planovalgus -I explained to patient the etiology of pes planovalgus and relationship with arch pain and various treatment options were discussed.  Given patient foot structure in the setting of arch pain is I believe patient will benefit from  custom-made orthotics to help control the hindfoot motion support the arch of the foot and take the stress away from arches.  Patient agrees with the plan like to proceed with orthotics -Patient was casted for orthotics   No follow-ups on file.

## 2022-07-04 ENCOUNTER — Ambulatory Visit: Payer: Federal, State, Local not specified - PPO | Admitting: *Deleted

## 2022-07-04 DIAGNOSIS — Q666 Other congenital valgus deformities of feet: Secondary | ICD-10-CM

## 2022-07-04 NOTE — Progress Notes (Signed)
Patient presents today to pick up custom molded foot orthotics, diagnosed with pes planovalgus by Dr. Allena Katz.   Orthotics were dispensed and fit was satisfactory. Reviewed instructions for break-in and wear. Written instructions given to patient.  Patient will follow up as needed.   [facility] - order # [id number]

## 2024-08-27 ENCOUNTER — Emergency Department (HOSPITAL_BASED_OUTPATIENT_CLINIC_OR_DEPARTMENT_OTHER): Payer: Worker's Compensation

## 2024-08-27 ENCOUNTER — Inpatient Hospital Stay (HOSPITAL_BASED_OUTPATIENT_CLINIC_OR_DEPARTMENT_OTHER)
Admission: EM | Admit: 2024-08-27 | Discharge: 2024-08-30 | DRG: 871 | Disposition: A | Discharge status: Home / Self Care | Payer: Worker's Compensation | Source: Other Acute Inpatient Hospital | Attending: Internal Medicine | Admitting: Internal Medicine

## 2024-08-27 ENCOUNTER — Other Ambulatory Visit: Payer: Self-pay

## 2024-08-27 ENCOUNTER — Emergency Department (HOSPITAL_BASED_OUTPATIENT_CLINIC_OR_DEPARTMENT_OTHER): Payer: Worker's Compensation | Admitting: Radiology

## 2024-08-27 DIAGNOSIS — Z79899 Other long term (current) drug therapy: Secondary | ICD-10-CM

## 2024-08-27 DIAGNOSIS — A419 Sepsis, unspecified organism: Secondary | ICD-10-CM | POA: Diagnosis not present

## 2024-08-27 DIAGNOSIS — E785 Hyperlipidemia, unspecified: Secondary | ICD-10-CM | POA: Diagnosis present

## 2024-08-27 DIAGNOSIS — Y990 Civilian activity done for income or pay: Secondary | ICD-10-CM

## 2024-08-27 DIAGNOSIS — S82045A Nondisplaced comminuted fracture of left patella, initial encounter for closed fracture: Secondary | ICD-10-CM | POA: Diagnosis present

## 2024-08-27 DIAGNOSIS — N179 Acute kidney failure, unspecified: Secondary | ICD-10-CM | POA: Diagnosis present

## 2024-08-27 DIAGNOSIS — D649 Anemia, unspecified: Secondary | ICD-10-CM | POA: Diagnosis present

## 2024-08-27 DIAGNOSIS — R300 Dysuria: Secondary | ICD-10-CM | POA: Diagnosis not present

## 2024-08-27 DIAGNOSIS — E876 Hypokalemia: Secondary | ICD-10-CM | POA: Diagnosis present

## 2024-08-27 DIAGNOSIS — W010XXA Fall on same level from slipping, tripping and stumbling without subsequent striking against object, initial encounter: Secondary | ICD-10-CM | POA: Diagnosis present

## 2024-08-27 DIAGNOSIS — E66811 Obesity, class 1: Secondary | ICD-10-CM | POA: Diagnosis present

## 2024-08-27 DIAGNOSIS — J9601 Acute respiratory failure with hypoxia: Secondary | ICD-10-CM | POA: Diagnosis present

## 2024-08-27 DIAGNOSIS — J189 Pneumonia, unspecified organism: Principal | ICD-10-CM | POA: Diagnosis present

## 2024-08-27 DIAGNOSIS — J9811 Atelectasis: Secondary | ICD-10-CM | POA: Diagnosis present

## 2024-08-27 DIAGNOSIS — E8809 Other disorders of plasma-protein metabolism, not elsewhere classified: Secondary | ICD-10-CM | POA: Diagnosis present

## 2024-08-27 DIAGNOSIS — R0902 Hypoxemia: Secondary | ICD-10-CM

## 2024-08-27 DIAGNOSIS — I10 Essential (primary) hypertension: Secondary | ICD-10-CM | POA: Diagnosis present

## 2024-08-27 DIAGNOSIS — G4733 Obstructive sleep apnea (adult) (pediatric): Secondary | ICD-10-CM | POA: Diagnosis present

## 2024-08-27 DIAGNOSIS — Z6831 Body mass index (BMI) 31.0-31.9, adult: Secondary | ICD-10-CM

## 2024-08-27 DIAGNOSIS — Z202 Contact with and (suspected) exposure to infections with a predominantly sexual mode of transmission: Secondary | ICD-10-CM

## 2024-08-27 DIAGNOSIS — Z791 Long term (current) use of non-steroidal anti-inflammatories (NSAID): Secondary | ICD-10-CM

## 2024-08-27 LAB — CBC WITH DIFFERENTIAL/PLATELET
Abs Immature Granulocytes: 0.08 K/uL — ABNORMAL HIGH (ref 0.00–0.07)
Basophils Absolute: 0.1 K/uL (ref 0.0–0.1)
Basophils Relative: 0 %
Eosinophils Absolute: 0.1 K/uL (ref 0.0–0.5)
Eosinophils Relative: 1 %
HCT: 36.2 % — ABNORMAL LOW (ref 39.0–52.0)
Hemoglobin: 12.2 g/dL — ABNORMAL LOW (ref 13.0–17.0)
Immature Granulocytes: 1 %
Lymphocytes Relative: 8 %
Lymphs Abs: 1.2 K/uL (ref 0.7–4.0)
MCH: 29.8 pg (ref 26.0–34.0)
MCHC: 33.7 g/dL (ref 30.0–36.0)
MCV: 88.3 fL (ref 80.0–100.0)
Monocytes Absolute: 1.3 K/uL — ABNORMAL HIGH (ref 0.1–1.0)
Monocytes Relative: 8 %
Neutro Abs: 12.8 K/uL — ABNORMAL HIGH (ref 1.7–7.7)
Neutrophils Relative %: 82 %
Platelets: 370 K/uL (ref 150–400)
RBC: 4.1 MIL/uL — ABNORMAL LOW (ref 4.22–5.81)
RDW: 12.5 % (ref 11.5–15.5)
WBC: 15.6 K/uL — ABNORMAL HIGH (ref 4.0–10.5)
nRBC: 0 % (ref 0.0–0.2)

## 2024-08-27 LAB — BASIC METABOLIC PANEL WITH GFR
Anion gap: 16 — ABNORMAL HIGH (ref 5–15)
BUN: 38 mg/dL — ABNORMAL HIGH (ref 8–23)
CO2: 21 mmol/L — ABNORMAL LOW (ref 22–32)
Calcium: 9.5 mg/dL (ref 8.9–10.3)
Chloride: 100 mmol/L (ref 98–111)
Creatinine, Ser: 1.93 mg/dL — ABNORMAL HIGH (ref 0.61–1.24)
GFR, Estimated: 39 mL/min — ABNORMAL LOW (ref >60)
Glucose, Bld: 130 mg/dL — ABNORMAL HIGH (ref 70–99)
Potassium: 4.1 mmol/L (ref 3.5–5.1)
Sodium: 136 mmol/L (ref 135–145)

## 2024-08-27 LAB — PRO BRAIN NATRIURETIC PEPTIDE: Pro Brain Natriuretic Peptide: 59.9 pg/mL (ref <300.0)

## 2024-08-27 MED ORDER — LACTATED RINGERS IV BOLUS
1000.0000 mL | Freq: Once | INTRAVENOUS | Status: AC
  Administered 2024-08-28: 1000 mL via INTRAVENOUS

## 2024-08-27 MED ORDER — OXYCODONE-ACETAMINOPHEN 5-325 MG PO TABS
1.0000 | ORAL_TABLET | Freq: Once | ORAL | Status: AC
  Administered 2024-08-27: 1 via ORAL
  Filled 2024-08-27: qty 1

## 2024-08-27 MED ORDER — IOHEXOL 350 MG/ML SOLN
75.0000 mL | Freq: Once | INTRAVENOUS | Status: AC | PRN
  Administered 2024-08-27: 75 mL via INTRAVENOUS

## 2024-08-27 MED ORDER — SODIUM CHLORIDE 0.9 % IV BOLUS
1000.0000 mL | Freq: Once | INTRAVENOUS | Status: AC
  Administered 2024-08-27: 1000 mL via INTRAVENOUS

## 2024-08-27 MED ORDER — SODIUM CHLORIDE 0.9 % IV SOLN
500.0000 mg | Freq: Once | INTRAVENOUS | Status: AC
  Administered 2024-08-27: 500 mg via INTRAVENOUS
  Filled 2024-08-27: qty 5

## 2024-08-27 MED ORDER — SODIUM CHLORIDE 0.9 % IV SOLN
1.0000 g | Freq: Once | INTRAVENOUS | Status: AC
  Administered 2024-08-27: 1 g via INTRAVENOUS
  Filled 2024-08-27: qty 10

## 2024-08-27 NOTE — ED Notes (Signed)
 RT assessed pt for desaturation on RA to upper 80's. Pt respiratory status stable on RA w/desaturations occurring while sleeping. Pt has hx of OSA w/out use of CPAP per pt. Pt placed on [place] 2Lpm to maintain sats of 90%/> at this time. Pt with no distress at this time.    08/27/24 2002  Therapy Vitals  Pulse Rate (!) 102  Resp 19  Patient Position (if appropriate) Lying  MEWS Score/Color  MEWS Score 1  MEWS Score Color Green  Respiratory Assessment  Assessment Type Assess only  Respiratory Pattern Regular;Unlabored;Symmetrical  Chest Assessment Chest expansion symmetrical  Cough None  Bilateral Breath Sounds Clear;Diminished  R Upper  Breath Sounds Clear  L Upper Breath Sounds Clear  R Lower Breath Sounds Clear;Diminished  L Lower Breath Sounds Clear;Diminished  Oxygen Therapy/Pulse Ox  O2 Device (S)  Nasal Cannula  SpO2 92 %  O2 Therapy (S)  Oxygen  O2 Flow Rate (L/min) (S)  2 L/min  FiO2 (%) (S)  28 %

## 2024-08-27 NOTE — ED Notes (Signed)
Awaiting dispo

## 2024-08-27 NOTE — ED Notes (Signed)
Ice pack applied to L knee

## 2024-08-27 NOTE — ED Provider Notes (Signed)
 [place] EMERGENCY DEPARTMENT AT [facility] Provider Note   CSN: [id number] Arrival date & time: 08/27/24  1715     Patient presents with: Jason Shepard. is a 63 y.o. male with past medical history of HTN, OSA presents Emergency Department for evaluation of left knee pain following tripping and falling over computer cords at work and falling onto his knee.  Never had surgery on left knee.  No head injury, LOC.  No complaints prior to injury.  While in triage, patient was noted to be tachycardic and 88% on room air.  He does not normally wear oxygen.  When asked about if he feels short of breath, he reports that he has felt short of breath for the past couple weeks.  He does have a history of sleep apnea and does not wear CPAP at night.  No tobacco abuse.  Works as a airline pilot.  Denies chest pain, cough, congestion, history of PE/DVT, pedal edema, hemoptysis, exogenous hormones, recent travel, recent surgery.  {Add pertinent medical, surgical, social history, OB history to YEP:67052}  Fall       Prior to Admission medications   Medication Sig Start Date End Date Taking? Authorizing Provider  cyclobenzaprine  (FLEXERIL ) 5 MG tablet One tablet every 8 hours when at home for muscle spasms. Will cause drowsiness 07/19/15   Tharon Lenis, NP  diclofenac  sodium (VOLTAREN ) 1 % GEL Apply 2 g topically 4 (four) times daily. 04/22/22   Lenor Hollering, MD  meloxicam  (MOBIC ) 7.5 MG tablet Take 1 tablet (7.5 mg total) by mouth daily. 04/22/22   Lenor Hollering, MD    Allergies: Patient has no known allergies.    Review of Systems  Musculoskeletal:  Positive for joint swelling.    Updated Vital Signs BP 92/79   Pulse 94   Temp 97.9 F (36.6 C)   Resp 18   Ht 5' 11 (1.803 m)   Wt 99.8 kg   SpO2 93%   BMI 30.68 kg/m   Physical Exam Vitals and nursing note reviewed.  Constitutional:      General: He is not in acute distress.    Appearance: Normal  appearance.  HENT:     Head: Normocephalic and atraumatic.  Eyes:     General: No scleral icterus.    Conjunctiva/sclera: Conjunctivae normal.  Cardiovascular:     Rate and Rhythm: Tachycardia present.     Pulses: Normal pulses.          Dorsalis pedis pulses are 2+ on the right side and 2+ on the left side.  Pulmonary:     Effort: Pulmonary effort is normal. No respiratory distress.     Breath sounds: Normal breath sounds.     Comments: Initially hypoxic at 88% with good pleth wave without oxygen.  Improved to 96% with 2 L O2 Abdominal:     General: There is no distension.     Palpations: Abdomen is soft.     Tenderness: There is no abdominal tenderness. There is no rebound.  Musculoskeletal:        General: Normal range of motion.     Left knee: Swelling and bony tenderness present.  Skin:    General: Skin is warm.     Capillary Refill: Capillary refill takes less than 2 seconds.     Coloration: Skin is not jaundiced or pale.  Neurological:     Mental Status: He is alert. Mental status is at baseline.     (  all labs ordered are listed, but only abnormal results are displayed) Labs Reviewed  CBC WITH DIFFERENTIAL/PLATELET - Abnormal; Notable for the following components:      Result Value   WBC 15.6 (*)    RBC 4.10 (*)    Hemoglobin 12.2 (*)    HCT 36.2 (*)    Neutro Abs 12.8 (*)    Monocytes Absolute 1.3 (*)    Abs Immature Granulocytes 0.08 (*)    All other components within normal limits  BASIC METABOLIC PANEL WITH GFR - Abnormal; Notable for the following components:   CO2 21 (*)    Glucose, Bld 130 (*)    BUN 38 (*)    Creatinine, Ser 1.93 (*)    GFR, Estimated 39 (*)    Anion gap 16 (*)    All other components within normal limits  PRO BRAIN NATRIURETIC PEPTIDE    EKG: None  Radiology: CT Angio Chest PE W and/or Wo Contrast Result Date: 08/27/2024 EXAM: CTA of the Chest with contrast for PE 08/27/2024 10:10:00 PM TECHNIQUE: CTA of the chest was  performed after the administration of 75 mL of iohexol (OMNIPAQUE) 350 MG/ML injection. Multiplanar reformatted images are provided for review. MIP images are provided for review. Automated exposure control, iterative reconstruction, and/or weight based adjustment of the mA/kV was utilized to reduce the radiation dose to as low as reasonably achievable. COMPARISON: Comparison with same day x-ray. CLINICAL HISTORY: Pulmonary embolism (PE) suspected, high prob. Shortness of breath and hypoxia FINDINGS: PULMONARY ARTERIES: Pulmonary arteries are adequately opacified for evaluation. No pulmonary embolism. Main pulmonary artery is normal in caliber. MEDIASTINUM: The heart and pericardium demonstrate no acute abnormality. There is no acute abnormality of the thoracic aorta. LYMPH NODES: Prominent prevascular lymph nodes measuring up to 8 mm in short axis (series 5 image 83). No hilar or axillary lymphadenopathy. LUNGS AND PLEURA: Bronchial wall thickening and mucus plugging in the lower lobes. Bibasilar scarring. 3.9 x 3.4 cm mass in the anterior left upper lobe corresponds with the abnormality on the same day chest radiograph. This may be due to pneumonia; however, malignancy is not excluded. Surrounding ground glass opacities. No pleural effusion or pneumothorax. UPPER ABDOMEN: Limited images of the upper abdomen are unremarkable. SOFT TISSUES AND BONES: No acute bone or soft tissue abnormality. IMPRESSION: 1. No evidence of pulmonary embolism. 2. 3.9 x 3.4 cm anterior left upper lobe mass with surrounding ground-glass opacities, which may represent pneumonia; however, malignancy is not excluded. Recommend prompt diagnostic evaluation with short-interval follow-up chest CT (e.g., 46 weeks after appropriate therapy) versus PET/CT and/or tissue sampling based on clinical assessment. 3. Prominent prevascular lymph nodes up to 8 mm in short axis, indeterminate; continued attention on follow-up imaging. Electronically  signed by: Norman Gatlin MD 08/27/2024 10:44 PM EST RP Workstation: [id number]   DG Chest Portable 1 View Result Date: 08/27/2024 CLINICAL DATA:  Short of breath, hypoxia EXAM: PORTABLE CHEST 1 VIEW COMPARISON:  06/02/2010 FINDINGS: Single frontal view of the chest demonstrates an unremarkable cardiac silhouette. There is a rounded area of consolidation in the left perihilar region in the left midlung zone, which could reflect focal bronchopneumonia. No effusion or pneumothorax. No acute bony abnormalities. IMPRESSION: 1. Focal area of rounded consolidation in the left mid lung zone, which could reflect bronchopneumonia. Followup PA and lateral chest X-ray is recommended in 3-4 weeks following trial of antibiotic therapy to ensure resolution and exclude underlying malignancy. Electronically Signed   By: Ozell Delores HERO.D.  On: 08/27/2024 20:47   CT Knee Left Wo Contrast Result Date: 08/27/2024 EXAM: CT LEFT KNEE, WITHOUT IV CONTRAST 08/27/2024 06:29:19 PM TECHNIQUE: Axial images were acquired through the left knee without IV contrast. Reformatted images were reviewed. Automated exposure control, iterative reconstruction, and/or weight based adjustment of the mA/kV was utilized to reduce the radiation dose to as low as reasonably achievable. COMPARISON: Same day knee x-ray. CLINICAL HISTORY: Knee trauma, tibial plateau fracture (Age >= 5y). FINDINGS: BONES: Comminuted nondisplaced vertical fractures of the lateral patella. Osseous loose body measuring 17 mm in the anterior lateral joint space. JOINTS: No dislocation. Small joint effusion. The joint spaces are normal. SOFT TISSUES: Prepatellar soft tissue swelling. IMPRESSION: 1. Comminuted nondisplaced vertical fractures of the lateral patella. 2. Osseous loose body measuring 17 mm in the anterolateral joint space. 3. Small joint effusion and prepatellar soft tissue swelling. Electronically signed by: Norman Gatlin MD 08/27/2024 06:35 PM EST RP Workstation:  [id number]   DG Knee 2 Views Left Result Date: 08/27/2024 EXAM: 1 or 2 VIEW(S) XRAY OF THE LEFT KNEE 08/27/2024 05:44:00 PM COMPARISON: None available. CLINICAL HISTORY: knee pain FINDINGS: BONES AND JOINTS: Curvilinear lucencies projecting over the lateral aspect of the patella and lateral femoral condyle. CT is recommended for further evaluation. No joint dislocation. Small joint effusion. Mild medial joint space narrowing. Possible 1.5 cm intra-articular body at anterior knee joint. SOFT TISSUES: Prepatellar soft tissue swelling. Vascular calcifications. IMPRESSION: 1. Curvilinear lucencies projecting over the lateral aspect of the patella and lateral femoral condyle; CT is recommended to exclude nondisplaced fracture. 2. Small joint effusion and prepatellar soft tissue swelling. 3. Possible 1.5 cm intra-articular body at the anterior knee joint. Electronically signed by: Norman Gatlin MD 08/27/2024 05:54 PM EST RP Workstation: [id number]    {Document cardiac monitor, telemetry assessment procedure when appropriate:32947} .Critical Care  Performed by: Minnie Tinnie BRAVO, PA Authorized by: Minnie Tinnie BRAVO, PA   Critical care provider statement:    Critical care time (minutes):  36   Critical care was necessary to treat or prevent imminent or life-threatening deterioration of the following conditions:  Sepsis   Critical care was time spent personally by me on the following activities:  Blood draw for specimens, development of treatment plan with patient or surrogate, discussions with consultants, evaluation of patient's response to treatment, examination of patient, interpretation of cardiac output measurements, obtaining history from patient or surrogate, ordering and performing treatments and interventions, ordering and review of radiographic studies, pulse oximetry, re-evaluation of patient's condition and review of old charts   Care discussed with: admitting provider      Medications Ordered in  the ED  lactated ringers bolus 1,000 mL (has no administration in time range)  oxyCODONE-acetaminophen (PERCOCET/ROXICET) 5-325 MG per tablet 1 tablet (1 tablet Oral Given 08/27/24 2004)  iohexol (OMNIPAQUE) 350 MG/ML injection 75 mL (75 mLs Intravenous Contrast Given 08/27/24 2233)  cefTRIAXone  (ROCEPHIN ) 1 g in sodium chloride 0.9 % 100 mL IVPB (0 g Intravenous Stopped 08/27/24 2322)  azithromycin (ZITHROMAX) 500 mg in sodium chloride 0.9 % 250 mL IVPB (500 mg Intravenous New Bag/Given 08/27/24 2247)  sodium chloride 0.9 % bolus 1,000 mL (0 mLs Intravenous Stopped 08/27/24 2313)      {Click here for ABCD2, HEART and other calculators REFRESH Note before signing:1}                              Medical Decision Making Amount and/or Complexity  of Data Reviewed Labs: ordered. Radiology: ordered.  Risk Prescription drug management. Decision regarding hospitalization.   Patient presents to the ED for concern of left knee pain following fall today, shortness of breath, this involves an extensive number of treatment options, and is a complaint that carries with it a high risk of complications and morbidity.  The differential diagnosis includes fracture, dislocation, contusion, abrasion, laceration, open fracture   Co morbidities that complicate the patient evaluation  See HPI   Additional history obtained:  Additional history obtained from Nursing   External records from outside source obtained and reviewed including triage RN note   Lab Tests:  I Ordered, and personally interpreted labs.  The pertinent results include:   BMP WNL Creatinine 1.93 Anion gap 16 Leukocytosis of 15.6 Hgb 12.2   Imaging Studies ordered:  I ordered imaging studies including chest x-ray, CT PE, left knee x-ray, CT knee I independently visualized and interpreted imaging which showed  No evidence of pulmonary embolism. 3.9 x 3.4 cm anterior left upper lobe mass with surrounding ground-glass opacities,  which may represent pneumonia; however, malignancy is not excluded  Comminuted nondisplaced vertical fractures of the lateral patella. Osseous loose body measuring 17 mm in the anterolateral joint space I agree with the radiologist interpretation   Cardiac Monitoring:  The patient was maintained on a cardiac monitor.  I personally viewed and interpreted the cardiac monitored which showed an underlying rhythm of: Sinus tachycardia at 106 bpm with no ST or T wave abnormalities   Medicines ordered and prescription drug management:  I ordered medication including LR, Rocephin , azithromycin for hydration, tachycardia, pneumonia Reevaluation of the patient after these medicines showed that the patient improved I have reviewed the patients home medicines and have made adjustments as needed     Critical Interventions:  Sepsis Resp failure. New O2 requirement   Consultations Obtained:  I requested consultation with orthopedic surgery Dr. Josefina, and discussed lab and imaging findings as well as pertinent plan-they recommend: Patella fracture noted, sagittal orientation with minimal displacement. No surgery planned. Knee immobilizer (6 weeks). Ok to [facility] with leg in full extension. Full consult to follow when he arrives at either [facility] or [facility] tomorrow.   I requested consultation with hospitalist,  and discussed lab and imaging findings as well as pertinent plan - they recommend:    Problem List / ED Course:  Left patellar fracture Initially, patient came in for left knee pain following fall today X-ray notable for possible fracture recommending CT imaging CT notable for patellar fracture Did consult with Ortho who recommended knee immobilizer and outpatient follow-up however as patient is being admitted we will formally see him tomorrow.  No current plans for surgery.  See their note  Sepsis PNA Tachycardic at 131 bpm and hypoxic at arrival.  Pressures have been soft in 90s-low 100s.   He reports that he has hypertension at baseline and takes blood pressure medications but is unsure what they are and I am unable to see them on chart review No fever while in ED but does have source of infection of pneumonia and tachycardia and leukocytosis of 15.6 Did provide 2 L LR for soft pressures, sepsis, tachycardia with improvement of tachycardia Chest x-ray notable for focal area of rounded consolidation in the left mid lung zone which could reflect pneumonia.  Also obtain CT PE due to tachycardia and hypoxia.  Notable for a left upper lobe mass with surrounding ground glass opacities which may represent pneumonia versus malignancy.  Did start patient on Rocephin  and azithromycin in ED for pneumonia Currently on 2L [place] for continued hypoxia  AKI Creatinine 1.93 Most recent CMP on file from 2022 creatinine 0.99 Provided LRx2   Reevaluation:  After the interventions noted above, I reevaluated the patient and found that they have :improved    Dispostion:  After consideration of the diagnostic results and the patients response to treatment, I feel that the patent would benefit from admission for sepsis, pneumonia, AKI, new oxygen requirement.   Discussed ED workup, disposition with patient expressed understand agrees with plan.  All questions answered to his satisfaction.  He is agreeable to plan  Final diagnoses:  Pneumonia of left lower lobe due to infectious organism  Hypoxia  Sepsis without acute organ dysfunction, due to unspecified organism [facility])  Closed nondisplaced comminuted fracture of left patella, initial encounter    ED Discharge Orders     None

## 2024-08-27 NOTE — ED Triage Notes (Signed)
 Pt POV ambulatory reporting L knee pain after mechanical fall this morning, tripped over wires in office.

## 2024-08-27 NOTE — Progress Notes (Signed)
 Called re: patient at [facility] with patella fracture being admitted for PNA and hypoxia.    Patella fracture noted, sagittal orientation with minimal displacement.  No surgery planned.  Knee immobilizer (6 weeks).  Ok to [facility] with leg in full extension.  Full consult to follow when he arrives at either [facility] or [facility] tomorrow.    Fonda SHAUNNA Olmsted, MD

## 2024-08-28 DIAGNOSIS — D649 Anemia, unspecified: Secondary | ICD-10-CM | POA: Diagnosis present

## 2024-08-28 DIAGNOSIS — R918 Other nonspecific abnormal finding of lung field: Secondary | ICD-10-CM | POA: Diagnosis not present

## 2024-08-28 DIAGNOSIS — J189 Pneumonia, unspecified organism: Secondary | ICD-10-CM

## 2024-08-28 DIAGNOSIS — N179 Acute kidney failure, unspecified: Secondary | ICD-10-CM | POA: Diagnosis present

## 2024-08-28 DIAGNOSIS — G4733 Obstructive sleep apnea (adult) (pediatric): Secondary | ICD-10-CM

## 2024-08-28 DIAGNOSIS — E8809 Other disorders of plasma-protein metabolism, not elsewhere classified: Secondary | ICD-10-CM | POA: Diagnosis present

## 2024-08-28 DIAGNOSIS — Z6831 Body mass index (BMI) 31.0-31.9, adult: Secondary | ICD-10-CM | POA: Diagnosis not present

## 2024-08-28 DIAGNOSIS — J9601 Acute respiratory failure with hypoxia: Secondary | ICD-10-CM | POA: Diagnosis present

## 2024-08-28 DIAGNOSIS — E785 Hyperlipidemia, unspecified: Secondary | ICD-10-CM | POA: Diagnosis present

## 2024-08-28 DIAGNOSIS — Z791 Long term (current) use of non-steroidal anti-inflammatories (NSAID): Secondary | ICD-10-CM | POA: Diagnosis not present

## 2024-08-28 DIAGNOSIS — W010XXA Fall on same level from slipping, tripping and stumbling without subsequent striking against object, initial encounter: Secondary | ICD-10-CM | POA: Diagnosis present

## 2024-08-28 DIAGNOSIS — R300 Dysuria: Secondary | ICD-10-CM | POA: Diagnosis present

## 2024-08-28 DIAGNOSIS — I10 Essential (primary) hypertension: Secondary | ICD-10-CM

## 2024-08-28 DIAGNOSIS — A419 Sepsis, unspecified organism: Secondary | ICD-10-CM | POA: Diagnosis present

## 2024-08-28 DIAGNOSIS — Z79899 Other long term (current) drug therapy: Secondary | ICD-10-CM | POA: Diagnosis not present

## 2024-08-28 DIAGNOSIS — Y990 Civilian activity done for income or pay: Secondary | ICD-10-CM | POA: Diagnosis not present

## 2024-08-28 DIAGNOSIS — E876 Hypokalemia: Secondary | ICD-10-CM | POA: Diagnosis present

## 2024-08-28 DIAGNOSIS — S82045A Nondisplaced comminuted fracture of left patella, initial encounter for closed fracture: Secondary | ICD-10-CM | POA: Diagnosis present

## 2024-08-28 DIAGNOSIS — E66811 Obesity, class 1: Secondary | ICD-10-CM | POA: Diagnosis present

## 2024-08-28 DIAGNOSIS — J9811 Atelectasis: Secondary | ICD-10-CM | POA: Diagnosis present

## 2024-08-28 MED ORDER — SODIUM CHLORIDE 0.9 % IV SOLN: INTRAVENOUS | Status: AC

## 2024-08-28 MED ORDER — MORPHINE SULFATE (PF) 4 MG/ML IV SOLN
4.0000 mg | Freq: Once | INTRAVENOUS | Status: AC
  Administered 2024-08-28: 4 mg via INTRAVENOUS
  Filled 2024-08-28: qty 1

## 2024-08-28 MED ORDER — OXYCODONE-ACETAMINOPHEN 5-325 MG PO TABS
1.0000 | ORAL_TABLET | ORAL | Status: DC | PRN
  Administered 2024-08-28 – 2024-08-29 (×6): 1 via ORAL
  Filled 2024-08-28 (×6): qty 1

## 2024-08-28 MED ORDER — SODIUM CHLORIDE 0.9 % IV BOLUS
1000.0000 mL | Freq: Once | INTRAVENOUS | Status: AC
  Administered 2024-08-28: 1000 mL via INTRAVENOUS

## 2024-08-28 MED ORDER — ONDANSETRON HCL 4 MG/2ML IJ SOLN
4.0000 mg | Freq: Once | INTRAMUSCULAR | Status: AC
  Administered 2024-08-28: 4 mg via INTRAVENOUS
  Filled 2024-08-28: qty 2

## 2024-08-28 NOTE — ED Notes (Signed)
 RT approached room, pt Sp02 90%, awakened pt, sit him up, Sp02 increased to 97%, pt appears to have a thick tongue blocking airway when in a deep sleep. Pt stated that he is not aware of any sleep apnea. RT tightened flowmeter, nasal cannula @ 3 L presently. Will discuss with provider of apnea events.

## 2024-08-28 NOTE — Progress Notes (Signed)
   08/28/24 1927  BiPAP/CPAP/SIPAP  BiPAP/CPAP/SIPAP Pt Type Adult  Reason BIPAP/CPAP not in use Non-compliant (pt declined CPAP)

## 2024-08-28 NOTE — ED Notes (Signed)
   08/28/24 1035  BiPAP/CPAP/SIPAP  $ Non-Invasive Ventilator  Non-Invasive Vent Initial  $ Face Mask Large  Yes  BiPAP/CPAP/SIPAP Pt Type Adult  BiPAP/CPAP/SIPAP DREAMSTATIOND  Mask Type Full face mask  Dentures removed? Not applicable  Mask Size Large  IPAP 14 cmH20  EPAP 6 cmH2O  FiO2 (%) 32 %  Patient Home Machine No  Patient Home Mask No  Patient Home Tubing No  Nasal massage performed No (comment)  CPAP/SIPAP surface wiped down Yes  Device Plugged into RED Power Outlet Yes  BiPAP/CPAP /SiPAP Vitals  Pulse Rate 78  Resp 11  SpO2 93 %  Bilateral Breath Sounds Clear;Diminished  MEWS Score/Color  MEWS Score 2  MEWS Score Color Yellow

## 2024-08-28 NOTE — ED Notes (Signed)
   08/28/24 1158  BiPAP/CPAP/SIPAP  BiPAP/CPAP/SIPAP Pt Type Adult  BiPAP/CPAP/SIPAP DREAMSTATIOND  Mask Type Full face mask  Dentures removed? Not applicable  Mask Size Medium  Respiratory Rate 14 breaths/min  IPAP (S)  20 cmH20 (Auto)  EPAP 6 cmH2O  FiO2 (%) (S)  40 %  Patient Home Machine No  Patient Home Mask No  Patient Home Tubing No  Auto Titrate (S)  Yes (Changed mode to Auto B: 20/6, PS 5, 5 L inline due to periodically desaturations.)  Nasal massage performed No (comment)  Device Plugged into RED Power Outlet Yes  BiPAP/CPAP /SiPAP Vitals  Pulse Rate 80  Resp 20  BP 106/73  SpO2 93 %  Bilateral Breath Sounds Clear;Diminished;Fine crackles (Crackles: LUL.)  MEWS Score/Color  MEWS Score 0  MEWS Score Color Green

## 2024-08-28 NOTE — H&P (Signed)
 History and Physical    Patient: Jason Shepard. FMW:[id number] DOB: 1961-08-12 DOA: 08/27/2024 DOS: the patient was seen and examined on 08/28/2024 PCP: Patient, No Pcp Per  Patient coming from: Home  Chief Complaint:  Chief Complaint  Patient presents with   Fall   HPI: Jason Shepard. is a 63 y.o. male with medical history significant for OSA not on CPAP who tripped at work and landed on his left knee.   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  He the swelling and pain got worse as the day weekday went on.  He presented to the emergency department because of his knee. While he was being evaluated it was noted that his sats dropped into the 80s.  The patient denied any shortness of breath.  He was not wheezing.  But that led him to get a CT scan of his chest which revealed no PE but a 3.9 x 3.4 cm left upper lobe mass with surrounding ground glass opacities. The patient was found to have a fracture of his patella and orthopedics was consulted.  They recommend a knee immobilizer for 6 weeks.  Because of the pneumonia and left upper lobe mass the patient will be admitted for treatment and work up.   Review of Systems: As mentioned in the history of present illness. All other systems reviewed and are negative. No past medical history on file. No past surgical history on file. Social History:  reports that he has never smoked. He does not have any smokeless tobacco history on file. He reports that he does not drink alcohol and does not use drugs.  No Known Allergies  No family history on file.  Prior to Admission medications   Medication Sig Start Date End Date Taking? Authorizing Provider  finasteride (PROSCAR) 5 MG tablet Take 5 mg by mouth daily. 08/06/24  Yes [provider]  Olmesartan-amLODIPine-HCTZ 40-10-12.5 MG TABS Take 1 tablet by mouth daily. 06/24/24  Yes [provider]  rosuvastatin  (CRESTOR) 40 MG tablet Take 40 mg by mouth daily. 06/06/21  Yes [provider]  cyclobenzaprine  (FLEXERIL ) 5 MG tablet One tablet every 8 hours when at home for muscle spasms. Will cause drowsiness 07/19/15   Tharon Lenis, NP  diclofenac  sodium (VOLTAREN ) 1 % GEL Apply 2 g topically 4 (four) times daily. Patient not taking: Reported on 08/28/2024 04/22/22   Lenor Hollering, MD  meloxicam  (MOBIC ) 7.5 MG tablet Take 1 tablet (7.5 mg total) by mouth daily. Patient not taking: Reported on 08/28/2024 04/22/22   Lenor Hollering, MD    Physical Exam: Vitals:   08/28/24 1408 08/28/24 1454 08/28/24 1650 08/28/24 1737  BP:   115/83 113/83  Pulse: 91  90 86  Resp: 18  12 16   Temp:  98.9 F (37.2 C) 98.9 F (37.2 C) (!) 97.3 F (36.3 C)  TempSrc:  Oral Oral Oral  SpO2: 92%  97% 96%  Weight:    103.6 kg  Height:    5' 11 (1.803 m)   Physical Exam:  General: No acute distress, well developed, well nourished HEENT: Normocephalic, atraumatic, PERRL, very thick neck, raspy voice Cardiovascular: Normal rate and rhythm. Distal pulses intact. Pulmonary: Normal pulmonary effort, normal breath sounds Gastrointestinal: Distended abdomen, non-tender, normoactive bowel sounds Musculoskeletal:No lower ext pitting edema Left knee is swollen and bruised Lymphadenopathy: No cervical LAD. Skin: Skin is warm and dry. Neuro:  No focal deficits noted, AAOx3. PSYCH: Attentive and cooperative  DataReviewed:  Results for orders placed or performed during the hospital encounter of 08/27/24 (from the past 24 hours)  CBC with Differential     Status: Abnormal   Collection Time: 08/27/24  8:20 PM  Result Value Ref Range   WBC 15.6 (H) 4.0 - 10.5 K/uL   RBC 4.10 (L) 4.22 - 5.81 MIL/uL   Hemoglobin 12.2 (L) 13.0 - 17.0 g/dL   HCT 63.7 (L) 60.9 - 47.9 %   MCV 88.3 80.0 - 100.0 fL   MCH 29.8 26.0 - 34.0 pg   MCHC 33.7 30.0 - 36.0 g/dL   RDW 87.4 88.4 - 84.4 %   Platelets 370 150 - 400 K/uL   nRBC 0.0 0.0 -  0.2 %   Neutrophils Relative % 82 %   Neutro Abs 12.8 (H) 1.7 - 7.7 K/uL   Lymphocytes Relative 8 %   Lymphs Abs 1.2 0.7 - 4.0 K/uL   Monocytes Relative 8 %   Monocytes Absolute 1.3 (H) 0.1 - 1.0 K/uL   Eosinophils Relative 1 %   Eosinophils Absolute 0.1 0.0 - 0.5 K/uL   Basophils Relative 0 %   Basophils Absolute 0.1 0.0 - 0.1 K/uL   Immature Granulocytes 1 %   Abs Immature Granulocytes 0.08 (H) 0.00 - 0.07 K/uL  Basic metabolic panel     Status: Abnormal   Collection Time: 08/27/24  8:20 PM  Result Value Ref Range   Sodium 136 135 - 145 mmol/L   Potassium 4.1 3.5 - 5.1 mmol/L   Chloride 100 98 - 111 mmol/L   CO2 21 (L) 22 - 32 mmol/L   Glucose, Bld 130 (H) 70 - 99 mg/dL   BUN 38 (H) 8 - 23 mg/dL   Creatinine, Ser 8.06 (H) 0.61 - 1.24 mg/dL   Calcium 9.5 8.9 - 89.6 mg/dL   GFR, Estimated 39 (L) >60 mL/min   Anion gap 16 (H) 5 - 15  Pro Brain natriuretic peptide     Status: None   Collection Time: 08/27/24  8:20 PM  Result Value Ref Range   Pro Brain Natriuretic Peptide 59.9 <300.0 pg/mL   CTA chest IMPRESSION: 1. No evidence of pulmonary embolism. 2. 3.9 x 3.4 cm anterior left upper lobe mass with surrounding ground-glass opacities, which may represent pneumonia; however, malignancy is not excluded. Recommend prompt diagnostic evaluation with short-interval follow-up chest CT (e.g., 46 weeks after appropriate therapy) versus PET/CT and/or tissue sampling based on clinical assessment. 3. Prominent prevascular lymph nodes up to 8 mm in short axis, indeterminate; continued attention on follow-up imaging.  Assessment and Plan: Left patellar fracture -  - Management per orthopedics - Knee immobilizer - Weightbearing as tolerated with leg in full extension  2.  Left upper lobe mass with surrounding opacities/ Hypoxic respiratory failure - the patient has been started on Rocephin  and Zithromax.  He is afebrile.  His white blood cell count is normal a lactic acid level was  not not sent. He was tachycardic when he arrived to the emergency department but he was also in severe pain at that time.  He is not septic. - pulmonary consult in am - O2 [place] as needed.  3. AKI - IV fluids.   Hold diuretic blood pressure medication  4. Obstructive sleep apnea- Not on cpap  5. Htn -holding combination pill as mentioned above  6. Hyperlipidemia -will hold Crestor for now      Advance Care  Planning:   Code Status: Not on file the patient names his wife as a runner, broadcasting/film/video.  He will be full code by default.  CODE STATUS was not discussed  Consults: Orthopedic surgery  Family Communication: None  Severity of Illness: The appropriate patient status for this patient is INPATIENT. Inpatient status is judged to be reasonable and necessary in order to provide the required intensity of service to ensure the patient's safety. The patient's presenting symptoms, physical exam findings, and initial radiographic and laboratory data in the context of their chronic comorbidities is felt to place them at high risk for further clinical deterioration. Furthermore, it is not anticipated that the patient will be medically stable for discharge from the hospital within 2 midnights of admission.   * I certify that at the point of admission it is my clinical judgment that the patient will require inpatient hospital care spanning beyond 2 midnights from the point of admission due to high intensity of service, high risk for further deterioration and high frequency of surveillance required.*  Author: ARTHEA CHILD, MD 08/28/2024 6:43 PM  For on call review [email or web address].

## 2024-08-28 NOTE — ED Notes (Signed)
 RT notified of decrease in SpO2... They stated that they would handle.SABRASABRA

## 2024-08-28 NOTE — Progress Notes (Signed)
 This is an admission coming from [facility] for sepsis 2/2 PNA, hypoxia, new O2 requirement and left knee patellar fx came in for knee fx then was found to be hypoxic and tachy. CT notable for lung mass (PNA vs neoplasm).  He was placed on rocephin  and azithromycin, LR. currently on O2 2L. EDP spoke to ortho Dr. Josefina who wants knee immobilizer and will see him tomorrow at [facility] or [facility] no surgery planned.  Telemetry bed at [facility] was ordered.

## 2024-08-28 NOTE — ED Notes (Signed)
Report attempted to the Floor RN.

## 2024-08-28 NOTE — ED Notes (Signed)
   08/28/24 1408  BiPAP/CPAP/SIPAP  Reason BIPAP/CPAP not in use (S)   (Pt awakened and wanted the bipap off, applied 2 L [place].)  BiPAP/CPAP /SiPAP Vitals  Pulse Rate 91  Resp 18  SpO2 92 %  MEWS Score/Color  MEWS Score 0  MEWS Score Color Green

## 2024-08-28 NOTE — ED Notes (Signed)
 Called [facility] to transport the patient to [facility] 5N rm#[id number]

## 2024-08-28 NOTE — ED Notes (Signed)
 Respiratory again notified of Pt and they were going to look at Pt.SABRASABRA

## 2024-08-29 DIAGNOSIS — J189 Pneumonia, unspecified organism: Secondary | ICD-10-CM

## 2024-08-29 DIAGNOSIS — J9601 Acute respiratory failure with hypoxia: Secondary | ICD-10-CM

## 2024-08-29 DIAGNOSIS — R918 Other nonspecific abnormal finding of lung field: Secondary | ICD-10-CM

## 2024-08-29 DIAGNOSIS — A419 Sepsis, unspecified organism: Principal | ICD-10-CM

## 2024-08-29 LAB — CBC
HCT: 33.1 % — ABNORMAL LOW (ref 39.0–52.0)
Hemoglobin: 11.1 g/dL — ABNORMAL LOW (ref 13.0–17.0)
MCH: 30.2 pg (ref 26.0–34.0)
MCHC: 33.5 g/dL (ref 30.0–36.0)
MCV: 89.9 fL (ref 80.0–100.0)
Platelets: 320 K/uL (ref 150–400)
RBC: 3.68 MIL/uL — ABNORMAL LOW (ref 4.22–5.81)
RDW: 12.8 % (ref 11.5–15.5)
WBC: 10.7 K/uL — ABNORMAL HIGH (ref 4.0–10.5)
nRBC: 0 % (ref 0.0–0.2)

## 2024-08-29 LAB — BASIC METABOLIC PANEL WITH GFR
Anion gap: 12 (ref 5–15)
BUN: 16 mg/dL (ref 8–23)
CO2: 25 mmol/L (ref 22–32)
Calcium: 8.1 mg/dL — ABNORMAL LOW (ref 8.9–10.3)
Chloride: 101 mmol/L (ref 98–111)
Creatinine, Ser: 0.86 mg/dL (ref 0.61–1.24)
GFR, Estimated: >60 mL/min (ref >60)
Glucose, Bld: 96 mg/dL (ref 70–99)
Potassium: 3.8 mmol/L (ref 3.5–5.1)
Sodium: 138 mmol/L (ref 135–145)

## 2024-08-29 LAB — HIV ANTIBODY (ROUTINE TESTING W REFLEX): HIV Screen 4th Generation wRfx: NONREACTIVE

## 2024-08-29 LAB — STREP PNEUMONIAE URINARY ANTIGEN: Strep Pneumo Urinary Antigen: NEGATIVE

## 2024-08-29 MED ORDER — BISACODYL 5 MG PO TBEC: 5.0000 mg | DELAYED_RELEASE_TABLET | Freq: Every day | ORAL | Status: DC | PRN

## 2024-08-29 MED ORDER — IPRATROPIUM-ALBUTEROL 0.5-2.5 (3) MG/3ML IN SOLN
3.0000 mL | Freq: Four times a day (QID) | RESPIRATORY_TRACT | Status: DC
  Administered 2024-08-29 – 2024-08-30 (×3): 3 mL via RESPIRATORY_TRACT
  Filled 2024-08-29 (×3): qty 3

## 2024-08-29 MED ORDER — ROSUVASTATIN CALCIUM 20 MG PO TABS
40.0000 mg | ORAL_TABLET | Freq: Every day | ORAL | Status: DC
  Administered 2024-08-29 – 2024-08-30 (×2): 40 mg via ORAL
  Filled 2024-08-29 (×2): qty 2

## 2024-08-29 MED ORDER — OXYCODONE-ACETAMINOPHEN 5-325 MG PO TABS: 1.0000 | ORAL_TABLET | ORAL | Status: DC | PRN

## 2024-08-29 MED ORDER — ACETAMINOPHEN 650 MG RE SUPP: 650.0000 mg | Freq: Four times a day (QID) | RECTAL | Status: DC | PRN

## 2024-08-29 MED ORDER — ENOXAPARIN SODIUM 40 MG/0.4ML IJ SOSY
40.0000 mg | PREFILLED_SYRINGE | INTRAMUSCULAR | Status: DC
  Administered 2024-08-29 – 2024-08-30 (×2): 40 mg via SUBCUTANEOUS
  Filled 2024-08-29 (×2): qty 0.4

## 2024-08-29 MED ORDER — SODIUM CHLORIDE 0.9% FLUSH
3.0000 mL | Freq: Two times a day (BID) | INTRAVENOUS | Status: DC
  Administered 2024-08-29 – 2024-08-30 (×3): 3 mL via INTRAVENOUS

## 2024-08-29 MED ORDER — AZITHROMYCIN 500 MG IV SOLR
500.0000 mg | INTRAVENOUS | Status: DC
  Administered 2024-08-29 – 2024-08-30 (×2): 500 mg via INTRAVENOUS
  Filled 2024-08-29 (×2): qty 5

## 2024-08-29 MED ORDER — ONDANSETRON HCL 4 MG/2ML IJ SOLN: 4.0000 mg | Freq: Four times a day (QID) | INTRAMUSCULAR | Status: DC | PRN

## 2024-08-29 MED ORDER — DOCUSATE SODIUM 100 MG PO CAPS
100.0000 mg | ORAL_CAPSULE | Freq: Two times a day (BID) | ORAL | Status: DC
  Administered 2024-08-29 – 2024-08-30 (×4): 100 mg via ORAL
  Filled 2024-08-29 (×4): qty 1

## 2024-08-29 MED ORDER — CEFTRIAXONE SODIUM 2 G IJ SOLR
2.0000 g | INTRAMUSCULAR | Status: DC
  Administered 2024-08-29 – 2024-08-30 (×2): 2 g via INTRAVENOUS
  Filled 2024-08-29 (×2): qty 20

## 2024-08-29 MED ORDER — ACETAMINOPHEN 325 MG PO TABS: 650.0000 mg | ORAL_TABLET | Freq: Four times a day (QID) | ORAL | Status: DC | PRN

## 2024-08-29 MED ORDER — ONDANSETRON HCL 4 MG PO TABS: 4.0000 mg | ORAL_TABLET | Freq: Four times a day (QID) | ORAL | Status: DC | PRN

## 2024-08-29 NOTE — Plan of Care (Signed)

## 2024-08-29 NOTE — Hospital Course (Addendum)
 Jason Shepard. is a 63 y.o. male with a history of OSA, hypertension.  Patient presented secondary to a fall with resultant knee pain and found to have comminuted nondisplaced vertical fractures of the lateral patella. Orthopedic surgery consulted and recommended non-surgical management with a knee immobilizer. During workup, patient was found to have hypoxia and CTA chest revealed a left upper lobe mass concerning for possible pneumonia, but could not rule out malignancy. Empiric antibiotics started. Pulmonology consulted.  Assessment and Plan:  Left patellar fracture Secondary to trip and fall at work. Imaging significant for a comminuted nondisplaced fracture of the patella. Orthopedic surgery consulted and recommend no surgical management and a knee immobilizer. -PT/OT eval   Left upper lobe mass Noted incidentally on CTA chest, which was significant for a 3.9 x 3.4 cm anterior left upper lobe mass concerning for pneumonia, but could not rule out malignancy. Empiric Ceftriaxone  and azithromycin started. -Continue Ceftriaxone  and azithromycin -Pulmonology consultation   Acute respiratory failure with hypoxia Patient with documented hypoxia with SpO2 down to 88%. Patient also reports dyspnea on exertion. 2 L/min of oxygen via nasal canula applied. -Wean to room air -Ambulatory pulse ox prior to discharge   AKI: Normal Cr at Baseline. Creatinine of 1.93 on admission. Resolved with IV fluids. -BUN/Cr Trend: Recent Labs  Lab 08/27/24 2020 08/29/24 0608  BUN 38* 16  CREATININE 1.93* 0.86   -Avoid Nephrotoxic Medications, Contrast Dyes, Hypotension and Dehydration to Ensure Adequate Renal Perfusion and will need to Renally Adjust Meds -Continue to Monitor and Trend Renal Function carefully and repeat *** in the AM   Normocytic Anemia: Hgb/Hct Trend:  Recent Labs  Lab 08/27/24 2020 08/29/24 0608  HGB 12.2* 11.1*  HCT 36.2* 33.1*  MCV 88.3 89.9  -Check Anemia Panel in the AM.  CTM for S/Sx of Bleeding; No overt bleeding noted. Repeat CBC in the AM   OSA Not on CPAP.   Prominent prevascular lymph nodes Noted on CT imaging measuring up to 8 mm. Indeterminate per radiology read. Will need attention on follow-up.   Primary hypertension Patient is on olmesartan-amlodipine as an outpatient which was held secondary to AKI. AKI resolved, but patient is currently normotensive with low-normal blood pressure. -Continue to hold antihypertensives.   Hyperlipidemia -Resume home Crestor  Hypoalbuminemia -Patient's Albumin Trend: No results for input(s): ALBUMIN in the last 720 hours. -Continue to Monitor and Trend and repeat CMP in the AM  Class I Obesity: Complicates overall prognosis and care. Estimated body mass index is 31.85 kg/m as calculated from the following:   Height as of this encounter: 5' 11 (1.803 m).   Weight as of this encounter: 103.6 kg. Weight Loss and Dietary Counseling given

## 2024-08-29 NOTE — Progress Notes (Signed)
 Orthopedic Tech Progress Note Patient Details:  Gershom Brobeck. 1961-10-08 [id number] Applied knee immobilizer per order.  Ortho Devices Type of Ortho Device: Knee Immobilizer Ortho Device/Splint Location: LLE Ortho Device/Splint Interventions: Ordered, Application, Adjustment   Post Interventions Patient Tolerated: Well Instructions Provided: Adjustment of device, Care of device, Poper ambulation with device  Morna Pink 08/29/2024, 8:45 AM

## 2024-08-29 NOTE — Consult Note (Signed)
 NAME:  Jason Shepard., MRN:  [id number], DOB:  [birth year], LOS: 1 ADMISSION DATE:  08/27/2024, CONSULTATION DATE:  08/29/2024  REFERRING MD:  Dr Norina DELENA Lam, CHIEF COMPLAINT:  lung mass   History of present illness    63 y.o. male with medical history significant for OSA not on CPAP who tripped at work and landed on his left knee.   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  He the swelling and pain got worse as the day weekday went on.  He presented to the emergency department because of his knee. While he was being evaluated it was noted that his sats dropped into the 80s.  The patient denied any shortness of breath. CT chest revealed  a 3.9 x 3.4 cm left upper lobe mass with surrounding ground glass opacities. Pulmonary is consulted for the same.  The patient was found to have a fracture of his patella and orthopedics was consulted   He is a non smoker. Denies personal hx of asthma but he thinks he may have developed it  Reports pain in knee  Past Medical History  He,  has no past medical history on file.   Consults:  orthopedics  Procedures:    Significant Diagnostic Tests:  CT chest 08/2024 1. No evidence of pulmonary embolism. 2. 3.9 x 3.4 cm anterior left upper lobe mass with surrounding ground-glass opacities, which may represent pneumonia; however, malignancy is not excluded. Recommend prompt diagnostic evaluation with short-interval follow-up chest CT (e.g., 46 weeks after appropriate therapy) versus PET/CT and/or tissue sampling based on clinical assessment. 3. Prominent prevascular lymph nodes up to 8 mm in short axis, indeterminate; continued attention on follow-up imaging.   Micro Data:   Cultures pending  Antimicrobials:  Ceftriaxone +azithromycin  Objective   Blood pressure 126/75, pulse 94, temperature 98.8 F (37.1 C), resp. rate 18, height 5' 11 (1.803 m), weight 103.6 kg, SpO2  92%.        Intake/Output Summary (Last 24 hours) at 08/29/2024 1859 Last data filed at 08/29/2024 1439 Gross per 24 hour  Intake 1612.08 ml  Output 2125 ml  Net -512.92 ml   Filed Weights   08/27/24 1728 08/28/24 1737  Weight: 99.8 kg 103.6 kg    Examination: Awake Conversant No distress No rales, now wheezing  Resolved Hospital Problem list     Assessment & Plan:  Left lung mass Likely CAP Acute hypoxic respi failure-resolved   - agree with empiric antibiotics - check urine legionella and strep ag - sputum culture pending - given hypoxia at presentation, this is likely CAP - can't rule out asthma. Started duonebs. Would benefit from empiric inhaled steroids like qvar or asmanex or symbicort or advair at discharge. Has to rinse mouth after use - treat with 7 days of antibiotics - will set up pulm clinic follow up for repeat CT in 4-6 weeks   Labs   CBC: Recent Labs  Lab 08/27/24 2020 08/29/24 0608  WBC 15.6* 10.7*  NEUTROABS 12.8*  --   HGB 12.2* 11.1*  HCT 36.2* 33.1*  MCV 88.3 89.9  PLT 370 320    Basic Metabolic Panel: Recent Labs  Lab 08/27/24 2020 08/29/24 0608  NA 136 138  K 4.1 3.8  CL 100 101  CO2 21* 25  GLUCOSE 130* 96  BUN 38* 16  CREATININE 1.93* 0.86  CALCIUM 9.5 8.1*   GFR: Estimated  Creatinine Clearance: 110.5 mL/min (by C-G formula based on SCr of 0.86 mg/dL). Recent Labs  Lab 08/27/24 2020 08/29/24 0608  WBC 15.6* 10.7*    Liver Function Tests: No results for input(s): AST, ALT, ALKPHOS, BILITOT, PROT, ALBUMIN in the last 168 hours. No results for input(s): LIPASE, AMYLASE in the last 168 hours. No results for input(s): AMMONIA in the last 168 hours.  ABG No results found for: PHART, PCO2ART, PO2ART, HCO3, TCO2, ACIDBASEDEF, O2SAT   Coagulation Profile: No results for input(s): INR, PROTIME in the last 168 hours.  Cardiac Enzymes: No results for input(s): CKTOTAL, CKMB,  CKMBINDEX, TROPONINI in the last 168 hours.  HbA1C: No results found for: HGBA1C  CBG: No results for input(s): GLUCAP in the last 168 hours.  Review of Systems:   Denies shortness of breath , hemoptysis  Surgical History   No past surgical history on file.   Social History   reports that he has never smoked. He does not have any smokeless tobacco history on file. He reports that he does not drink alcohol and does not use drugs.   Family History   His family history is not on file.   Allergies No Known Allergies   Home Medications  Prior to Admission medications   Medication Sig Start Date End Date Taking? Authorizing Provider  finasteride (PROSCAR) 5 MG tablet Take 5 mg by mouth daily. 08/06/24  Yes [provider]  Olmesartan-amLODIPine-HCTZ 40-10-12.5 MG TABS Take 1 tablet by mouth daily. 06/24/24  Yes [provider]  rosuvastatin (CRESTOR) 40 MG tablet Take 40 mg by mouth daily. 06/06/21  Yes [provider]  cyclobenzaprine  (FLEXERIL ) 5 MG tablet One tablet every 8 hours when at home for muscle spasms. Will cause drowsiness 07/19/15   Tharon Lenis, NP  diclofenac  sodium (VOLTAREN ) 1 % GEL Apply 2 g topically 4 (four) times daily. Patient not taking: Reported on 08/28/2024 04/22/22   Lenor Hollering, MD  meloxicam  (MOBIC ) 7.5 MG tablet Take 1 tablet (7.5 mg total) by mouth daily. Patient not taking: Reported on 08/28/2024 04/22/22   Lenor Hollering, MD     Critical care time n/a    Tine M[facility]ee [facility] 08/29/2024 6:59 PM  Pager: see amion  If no response to pager , please call critical care on call (see AMION) until 7pm After 7:00 pm call Elink

## 2024-08-29 NOTE — Progress Notes (Signed)
SATURATION QUALIFICATIONS: (This note is used to comply with regulatory documentation for home oxygen)  Patient Saturations on Room Air at Rest = 93%  Patient Saturations on Room Air while Ambulating = 94%   

## 2024-08-29 NOTE — Progress Notes (Addendum)
 PROGRESS NOTE    Jason Shepard.  FMW:[id number] DOB: Jun 17, 1961 DOA: 08/27/2024 PCP: Patient, No Pcp Per   Brief Narrative: Jason Shepard. is a 63 y.o. male with a history of OSA, hypertension.  Patient presented secondary to a fall with resultant knee pain and found to have comminuted nondisplaced vertical fractures of the lateral patella. Orthopedic surgery consulted and recommended non-surgical management with a knee immobilizer. During workup, patient was found to have hypoxia and CTA chest revealed a left upper lobe mass concerning for possible pneumonia, but could not rule out malignancy. Empiric antibiotics started. Pulmonology consulted.   Assessment and Plan:  Left patellar fracture Secondary to trip and fall at work. Imaging significant for a comminuted nondisplaced fracture of the patella. Orthopedic surgery consulted and recommend no surgical management and a knee immobilizer. -PT/OT eval  Left upper lobe mass Noted incidentally on CTA chest, which was significant for a 3.9 x 3.4 cm anterior left upper lobe mass concerning for pneumonia, but could not rule out malignancy. Empiric Ceftriaxone  and azithromycin started. -Continue Ceftriaxone  and azithromycin -Pulmonology consultation  Acute respiratory failure with hypoxia Patient with documented hypoxia with SpO2 down to 88%. Patient also reports dyspnea on exertion. 2 L/min of oxygen via nasal canula applied. -Wean to room air -Ambulatory pulse ox prior to discharge  AKI Creatinine of 1.93 on admission. Resolved with IV fluids.  OSA Not on CPAP.  Prominent prevascular lymph nodes Noted on CT imaging measuring up to 8 mm. Indeterminate per radiology read. Will need attention on follow-up.  Primary hypertension Patient is on olmesartan-amlodipine as an outpatient which was held secondary to AKI. AKI resolved, but patient is currently normotensive with low-normal blood pressure. -Continue to hold  antihypertensives.  Hyperlipidemia -Resume home Crestor   DVT prophylaxis: Lovenox Code Status:   Code Status: Full Code Family Communication:  None at bedside Disposition Plan: Discharge home likely in 24 hours once able to wean off oxygen, pending specialist recommendations, transition to oral antibiotics   Consultants:  Orthopedic surgery Pulmonology  Procedures:  None  Antimicrobials: Ceftriaxone  Azithromycin    Subjective: Patient reports no specific issues overnight. He reports a dry cough; no sputum. Afebrile overnight.  Objective: BP (!) 91/55   Pulse 91   Temp 98.1 F (36.7 C) (Oral)   Resp 18   Ht 5' 11 (1.803 m)   Wt 103.6 kg   SpO2 93%   BMI 31.85 kg/m   Examination:  General exam: Appears calm and comfortable. Respiratory system: Clear to auscultation. Respiratory effort normal. Cardiovascular system: S1 & S2 heard, RRR. No murmur. Gastrointestinal system: Abdomen is nondistended, soft and nontender. Normal bowel sounds heard. Central nervous system: Alert and oriented. No focal neurological deficits. Musculoskeletal: No edema. No calf tenderness Psychiatry: Judgement and insight appear normal. Mood & affect appropriate.    Data Reviewed: I have personally reviewed following labs and imaging studies  CBC Lab Results  Component Value Date   WBC 10.7 (H) 08/29/2024   RBC 3.68 (L) 08/29/2024   HGB 11.1 (L) 08/29/2024   HCT 33.1 (L) 08/29/2024   MCV 89.9 08/29/2024   MCH 30.2 08/29/2024   PLT 320 08/29/2024   MCHC 33.5 08/29/2024   RDW 12.8 08/29/2024   LYMPHSABS 1.2 08/27/2024   MONOABS 1.3 (H) 08/27/2024   EOSABS 0.1 08/27/2024   BASOSABS 0.1 08/27/2024     Last metabolic panel Lab Results  Component Value Date   NA 138 08/29/2024   K 3.8  08/29/2024   CL 101 08/29/2024   CO2 25 08/29/2024   BUN 16 08/29/2024   CREATININE 0.86 08/29/2024   GLUCOSE 96 08/29/2024   GFRNONAA >60 08/29/2024   GFRAA  06/02/2010    >60         The eGFR has been calculated using the MDRD equation. This calculation has not been validated in all clinical situations. eGFR's persistently <60 mL/min signify possible Chronic Kidney Disease.   CALCIUM 8.1 (L) 08/29/2024   PROT 7.4 06/02/2010   ALBUMIN 4.3 06/02/2010   BILITOT 2.2 (H) 06/02/2010   ALKPHOS 62 06/02/2010   AST 29 06/02/2010   ALT 38 06/02/2010   ANIONGAP 12 08/29/2024    GFR: Estimated Creatinine Clearance: 110.5 mL/min (by C-G formula based on SCr of 0.86 mg/dL).  No results found for this or any previous visit (from the past 240 hours).    Radiology Studies: CT Angio Chest PE W and/or Wo Contrast Result Date: 08/27/2024 EXAM: CTA of the Chest with contrast for PE 08/27/2024 10:10:00 PM TECHNIQUE: CTA of the chest was performed after the administration of 75 mL of iohexol (OMNIPAQUE) 350 MG/ML injection. Multiplanar reformatted images are provided for review. MIP images are provided for review. Automated exposure control, iterative reconstruction, and/or weight based adjustment of the mA/kV was utilized to reduce the radiation dose to as low as reasonably achievable. COMPARISON: Comparison with same day x-ray. CLINICAL HISTORY: Pulmonary embolism (PE) suspected, high prob. Shortness of breath and hypoxia FINDINGS: PULMONARY ARTERIES: Pulmonary arteries are adequately opacified for evaluation. No pulmonary embolism. Main pulmonary artery is normal in caliber. MEDIASTINUM: The heart and pericardium demonstrate no acute abnormality. There is no acute abnormality of the thoracic aorta. LYMPH NODES: Prominent prevascular lymph nodes measuring up to 8 mm in short axis (series 5 image 83). No hilar or axillary lymphadenopathy. LUNGS AND PLEURA: Bronchial wall thickening and mucus plugging in the lower lobes. Bibasilar scarring. 3.9 x 3.4 cm mass in the anterior left upper lobe corresponds with the abnormality on the same day chest radiograph. This may be due to pneumonia;  however, malignancy is not excluded. Surrounding ground glass opacities. No pleural effusion or pneumothorax. UPPER ABDOMEN: Limited images of the upper abdomen are unremarkable. SOFT TISSUES AND BONES: No acute bone or soft tissue abnormality. IMPRESSION: 1. No evidence of pulmonary embolism. 2. 3.9 x 3.4 cm anterior left upper lobe mass with surrounding ground-glass opacities, which may represent pneumonia; however, malignancy is not excluded. Recommend prompt diagnostic evaluation with short-interval follow-up chest CT (e.g., 46 weeks after appropriate therapy) versus PET/CT and/or tissue sampling based on clinical assessment. 3. Prominent prevascular lymph nodes up to 8 mm in short axis, indeterminate; continued attention on follow-up imaging. Electronically signed by: Norman Gatlin MD 08/27/2024 10:44 PM EST RP Workstation: [id number]   DG Chest Portable 1 View Result Date: 08/27/2024 CLINICAL DATA:  Short of breath, hypoxia EXAM: PORTABLE CHEST 1 VIEW COMPARISON:  06/02/2010 FINDINGS: Single frontal view of the chest demonstrates an unremarkable cardiac silhouette. There is a rounded area of consolidation in the left perihilar region in the left midlung zone, which could reflect focal bronchopneumonia. No effusion or pneumothorax. No acute bony abnormalities. IMPRESSION: 1. Focal area of rounded consolidation in the left mid lung zone, which could reflect bronchopneumonia. Followup PA and lateral chest X-ray is recommended in 3-4 weeks following trial of antibiotic therapy to ensure resolution and exclude underlying malignancy. Electronically Signed   By: Ozell Daring M.D.   On: 08/27/2024 20:47  CT Knee Left Wo Contrast Result Date: 08/27/2024 EXAM: CT LEFT KNEE, WITHOUT IV CONTRAST 08/27/2024 06:29:19 PM TECHNIQUE: Axial images were acquired through the left knee without IV contrast. Reformatted images were reviewed. Automated exposure control, iterative reconstruction, and/or weight based  adjustment of the mA/kV was utilized to reduce the radiation dose to as low as reasonably achievable. COMPARISON: Same day knee x-ray. CLINICAL HISTORY: Knee trauma, tibial plateau fracture (Age >= 5y). FINDINGS: BONES: Comminuted nondisplaced vertical fractures of the lateral patella. Osseous loose body measuring 17 mm in the anterior lateral joint space. JOINTS: No dislocation. Small joint effusion. The joint spaces are normal. SOFT TISSUES: Prepatellar soft tissue swelling. IMPRESSION: 1. Comminuted nondisplaced vertical fractures of the lateral patella. 2. Osseous loose body measuring 17 mm in the anterolateral joint space. 3. Small joint effusion and prepatellar soft tissue swelling. Electronically signed by: Norman Gatlin MD 08/27/2024 06:35 PM EST RP Workstation: [id number]   DG Knee 2 Views Left Result Date: 08/27/2024 EXAM: 1 or 2 VIEW(S) XRAY OF THE LEFT KNEE 08/27/2024 05:44:00 PM COMPARISON: None available. CLINICAL HISTORY: knee pain FINDINGS: BONES AND JOINTS: Curvilinear lucencies projecting over the lateral aspect of the patella and lateral femoral condyle. CT is recommended for further evaluation. No joint dislocation. Small joint effusion. Mild medial joint space narrowing. Possible 1.5 cm intra-articular body at anterior knee joint. SOFT TISSUES: Prepatellar soft tissue swelling. Vascular calcifications. IMPRESSION: 1. Curvilinear lucencies projecting over the lateral aspect of the patella and lateral femoral condyle; CT is recommended to exclude nondisplaced fracture. 2. Small joint effusion and prepatellar soft tissue swelling. 3. Possible 1.5 cm intra-articular body at the anterior knee joint. Electronically signed by: Norman Gatlin MD 08/27/2024 05:54 PM EST RP Workstation: [id number]      LOS: 1 day    Elgin Lam, MD [facility] 08/29/2024, 10:43 AM   If 7PM-7AM, please contact night-coverage [email or web address]

## 2024-08-30 ENCOUNTER — Telehealth: Payer: Self-pay

## 2024-08-30 ENCOUNTER — Inpatient Hospital Stay (HOSPITAL_COMMUNITY)

## 2024-08-30 DIAGNOSIS — Z8781 Personal history of (healed) traumatic fracture: Secondary | ICD-10-CM

## 2024-08-30 LAB — COMPREHENSIVE METABOLIC PANEL WITH GFR
ALT: 26 U/L (ref 0–44)
AST: 21 U/L (ref 15–41)
Albumin: 3 g/dL — ABNORMAL LOW (ref 3.5–5.0)
Alkaline Phosphatase: 60 U/L (ref 38–126)
Anion gap: 11 (ref 5–15)
BUN: 11 mg/dL (ref 8–23)
CO2: 23 mmol/L (ref 22–32)
Calcium: 8.2 mg/dL — ABNORMAL LOW (ref 8.9–10.3)
Chloride: 102 mmol/L (ref 98–111)
Creatinine, Ser: 0.87 mg/dL (ref 0.61–1.24)
GFR, Estimated: >60 mL/min (ref >60)
Glucose, Bld: 201 mg/dL — ABNORMAL HIGH (ref 70–99)
Potassium: 3.4 mmol/L — ABNORMAL LOW (ref 3.5–5.1)
Sodium: 136 mmol/L (ref 135–145)
Total Bilirubin: 1 mg/dL (ref 0.0–1.2)
Total Protein: 6.4 g/dL — ABNORMAL LOW (ref 6.5–8.1)

## 2024-08-30 LAB — CBC WITH DIFFERENTIAL/PLATELET
Abs Immature Granulocytes: 0.03 K/uL (ref 0.00–0.07)
Basophils Absolute: 0 K/uL (ref 0.0–0.1)
Basophils Relative: 0 %
Eosinophils Absolute: 0.1 K/uL (ref 0.0–0.5)
Eosinophils Relative: 1 %
HCT: 34.8 % — ABNORMAL LOW (ref 39.0–52.0)
Hemoglobin: 11.6 g/dL — ABNORMAL LOW (ref 13.0–17.0)
Immature Granulocytes: 0 %
Lymphocytes Relative: 10 %
Lymphs Abs: 0.9 K/uL (ref 0.7–4.0)
MCH: 29.4 pg (ref 26.0–34.0)
MCHC: 33.3 g/dL (ref 30.0–36.0)
MCV: 88.1 fL (ref 80.0–100.0)
Monocytes Absolute: 0.6 K/uL (ref 0.1–1.0)
Monocytes Relative: 6 %
Neutro Abs: 8.1 K/uL — ABNORMAL HIGH (ref 1.7–7.7)
Neutrophils Relative %: 83 %
Platelets: 344 K/uL (ref 150–400)
RBC: 3.95 MIL/uL — ABNORMAL LOW (ref 4.22–5.81)
RDW: 12.2 % (ref 11.5–15.5)
WBC: 9.9 K/uL (ref 4.0–10.5)
nRBC: 0 % (ref 0.0–0.2)

## 2024-08-30 LAB — MAGNESIUM: Magnesium: 1.9 mg/dL (ref 1.7–2.4)

## 2024-08-30 LAB — PHOSPHORUS: Phosphorus: 2.5 mg/dL (ref 2.5–4.6)

## 2024-08-30 MED ORDER — METHOCARBAMOL 500 MG PO TABS: 500.0000 mg | ORAL_TABLET | Freq: Three times a day (TID) | ORAL | Status: DC | PRN

## 2024-08-30 MED ORDER — IPRATROPIUM-ALBUTEROL 0.5-2.5 (3) MG/3ML IN SOLN: 3.0000 mL | Freq: Two times a day (BID) | RESPIRATORY_TRACT | Status: DC

## 2024-08-30 MED ORDER — CEFDINIR 300 MG PO CAPS: 300.0000 mg | ORAL_CAPSULE | Freq: Two times a day (BID) | ORAL | 0 refills | Status: AC

## 2024-08-30 MED ORDER — AZITHROMYCIN 500 MG PO TABS: 500.0000 mg | ORAL_TABLET | Freq: Every day | ORAL | 0 refills | Status: AC

## 2024-08-30 MED ORDER — OXYCODONE-ACETAMINOPHEN 5-325 MG PO TABS
1.0000 | ORAL_TABLET | Freq: Three times a day (TID) | ORAL | 0 refills | Status: AC | PRN
Start: 2024-08-30 — End: ?

## 2024-08-30 MED ORDER — KETOROLAC TROMETHAMINE 10 MG PO TABS: 10.0000 mg | ORAL_TABLET | Freq: Four times a day (QID) | ORAL | 0 refills | Status: AC | PRN

## 2024-08-30 MED ORDER — ACETAMINOPHEN 325 MG PO TABS: 650.0000 mg | ORAL_TABLET | Freq: Four times a day (QID) | ORAL | 0 refills | Status: AC | PRN

## 2024-08-30 MED ORDER — METHOCARBAMOL 500 MG PO TABS: 500.0000 mg | ORAL_TABLET | Freq: Four times a day (QID) | ORAL | 0 refills | Status: AC | PRN

## 2024-08-30 MED ORDER — DOCUSATE SODIUM 100 MG PO CAPS: 100.0000 mg | ORAL_CAPSULE | Freq: Two times a day (BID) | ORAL | 0 refills | Status: AC

## 2024-08-30 MED ORDER — ONDANSETRON HCL 4 MG PO TABS: 4.0000 mg | ORAL_TABLET | Freq: Four times a day (QID) | ORAL | 0 refills | Status: AC | PRN

## 2024-08-30 MED ORDER — POTASSIUM CHLORIDE CRYS ER 20 MEQ PO TBCR: 40.0000 meq | EXTENDED_RELEASE_TABLET | Freq: Two times a day (BID) | ORAL | Status: DC

## 2024-08-30 NOTE — Plan of Care (Signed)
  Problem: Clinical Measurements: Goal: Diagnostic test results will improve 08/30/2024 1454 by Eliyana Pagliaro, Naomie ORN, RN Outcome: Adequate for Discharge 08/30/2024 1453 by Miles Borkowski, Naomie ORN, RN Outcome: Adequate for Discharge

## 2024-08-30 NOTE — Consult Note (Addendum)
 Orthopaedic Trauma Service (OTS) Consult   Patient ID: Jason Shepard. MRN: [id number] DOB/AGE: 11-07-1960 63 y.o.   Reason for Consult: Left patella fracture Referring Physician:  Alejandro Lazarus Marker, MD    HPI: Jason Shepard. is an 63 y.o. male who sustained a ground-level fall after tripping over some cords while at work at [employer]  Patient landed directly on his left knee.  He was initially brought to [facility] where he was found to have a vertically oriented fracture of his left patella.  During that evaluation he desatted into the 80s.  He was denying any symptoms of shortness of breath, chest pain.  CT scan of his chest was obtained which showed left upper lobe mass with surrounding ground glass opacities.  As such patient was admitted for pneumonia.  Patient ultimately transferred to [facility].  Pulmonary consult was also obtained and the working diagnosis is community-acquired pneumonia.  Orthopedics consulted for his left patella fracture.  He denies any injuries elsewhere.  Pain is tolerable to his left knee.  He is in a knee immobilizer and using ice.  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  No past surgical history on file.  No family history on file.  Social History:  reports that he has never smoked. He does not have any smokeless tobacco history on file. He reports that he does not drink alcohol and does not use drugs.  Allergies: No Known Allergies  Medications: I have reviewed the patient's current medications. Current Facility-Administered Medications for the 08/27/24 encounter [place] Encounter)  Medication   cefTRIAXone  (ROCEPHIN ) injection 250 mg   Current Meds  Medication Sig   finasteride (PROSCAR) 5 MG tablet Take 5 mg by mouth daily.   Olmesartan-amLODIPine-HCTZ 40-10-12.5 MG TABS Take 1 tablet by mouth daily.   rosuvastatin (CRESTOR) 40 MG tablet Take 40  mg by mouth daily.     Results for orders placed or performed during the hospital encounter of 08/27/24 (from the past 48 hours)  HIV Antibody (routine testing w rflx)     Status: None   Collection Time: 08/29/24  6:08 AM  Result Value Ref Range   HIV Screen 4th Generation wRfx Non Reactive Non Reactive    Comment: Performed at [facility], [address], [place], [place] [address]  Basic metabolic panel     Status: Abnormal   Collection Time: 08/29/24  6:08 AM  Result Value Ref Range   Sodium 138 135 - 145 mmol/L   Potassium 3.8 3.5 - 5.1 mmol/L   Chloride 101 98 - 111 mmol/L   CO2 25 22 - 32 mmol/L   Glucose, Bld 96 70 - 99 mg/dL    Comment: Glucose reference range applies only to samples taken after fasting for at least 8 hours.   BUN 16 8 - 23 mg/dL   Creatinine, Ser 9.13 0.61 - 1.24 mg/dL   Calcium 8.1 (L) 8.9 - 10.3 mg/dL   GFR, Estimated >39 >39 mL/min    Comment: (NOTE) Calculated using the CKD-EPI Creatinine Equation (2021)    Anion gap 12 5 - 15    Comment: Performed at [facility], [address] [place], [place] [address]  CBC     Status: Abnormal   Collection Time: 08/29/24  6:08 AM  Result Value Ref Range   WBC 10.7 (H) 4.0 -  10.5 K/uL   RBC 3.68 (L) 4.22 - 5.81 MIL/uL   Hemoglobin 11.1 (L) 13.0 - 17.0 g/dL   HCT 66.8 (L) 60.9 - 47.9 %   MCV 89.9 80.0 - 100.0 fL   MCH 30.2 26.0 - 34.0 pg   MCHC 33.5 30.0 - 36.0 g/dL   RDW 87.1 88.4 - 84.4 %   Platelets 320 150 - 400 K/uL   nRBC 0.0 0.0 - 0.2 %    Comment: Performed at [facility], [address], [place], [place] [address]  Strep pneumoniae urinary antigen     Status: None   Collection Time: 08/29/24  9:45 PM  Result Value Ref Range   Strep Pneumo Urinary Antigen NEGATIVE NEGATIVE    Comment:        Infection due to S. pneumoniae cannot be absolutely ruled out since the antigen present may be below the detection limit of the test. Performed at [facility], [address] [place], [place] [address]   CBC with Differential/Platelet     Status: Abnormal   Collection Time: 08/30/24  9:21 AM  Result Value Ref Range   WBC 9.9 4.0 - 10.5 K/uL   RBC 3.95 (L) 4.22 - 5.81 MIL/uL   Hemoglobin 11.6 (L) 13.0 - 17.0 g/dL   HCT 65.1 (L) 60.9 - 47.9 %   MCV 88.1 80.0 - 100.0 fL   MCH 29.4 26.0 - 34.0 pg   MCHC 33.3 30.0 - 36.0 g/dL   RDW 87.7 88.4 - 84.4 %   Platelets 344 150 - 400 K/uL   nRBC 0.0 0.0 - 0.2 %   Neutrophils Relative % 83 %   Neutro Abs 8.1 (H) 1.7 - 7.7 K/uL   Lymphocytes Relative 10 %   Lymphs Abs 0.9 0.7 - 4.0 K/uL   Monocytes Relative 6 %   Monocytes Absolute 0.6 0.1 - 1.0 K/uL   Eosinophils Relative 1 %   Eosinophils Absolute 0.1 0.0 - 0.5 K/uL   Basophils Relative 0 %   Basophils Absolute 0.0 0.0 - 0.1 K/uL   Immature Granulocytes 0 %   Abs Immature Granulocytes 0.03 0.00 - 0.07 K/uL    Comment: Performed at [facility], [address] [place], [place] [address]  Comprehensive metabolic panel     Status: Abnormal   Collection Time: 08/30/24  9:21 AM  Result Value Ref Range   Sodium 136 135 - 145 mmol/L   Potassium 3.4 (L) 3.5 - 5.1 mmol/L   Chloride 102 98 - 111 mmol/L   CO2 23 22 - 32 mmol/L   Glucose, Bld 201 (H) 70 - 99 mg/dL    Comment: Glucose reference range applies only to samples taken after fasting for at least 8 hours.   BUN 11 8 - 23 mg/dL   Creatinine, Ser 9.12 0.61 - 1.24 mg/dL   Calcium 8.2 (L) 8.9 - 10.3 mg/dL   Total Protein 6.4 (L) 6.5 - 8.1 g/dL   Albumin 3.0 (L) 3.5 - 5.0 g/dL   AST 21 15 - 41 U/L   ALT 26 0 - 44 U/L   Alkaline Phosphatase 60 38 - 126 U/L   Total Bilirubin 1.0 0.0 - 1.2 mg/dL   GFR, Estimated >39 >39 mL/min    Comment: (NOTE) Calculated using the CKD-EPI Creatinine Equation (2021)    Anion gap 11 5 - 15    Comment: Performed at [facility], [address] [place], [place] [address]  Magnesium     Status: None   Collection Time: 08/30/24  9:21 AM  Result Value Ref Range    Magnesium 1.9 1.7 - 2.4 mg/dL    Comment: Performed at [facility], [address], [place], [place] [address]  Phosphorus     Status: None   Collection Time: 08/30/24  9:21 AM  Result Value Ref Range   Phosphorus 2.5 2.5 - 4.6 mg/dL    Comment: Performed at [facility], [address] [place], [place] [address]    No results found.  Intake/Output      11/08 0701 11/09 0700 11/09 0701 11/10 0700   I.V. (mL/kg) 918.9 (8.9)    IV Piggyback 102.9    Total Intake(mL/kg) 1021.7 (9.9)    Urine (mL/kg/hr) 3200 (1.3) 1175 (2.4)   Stool  0   Total Output 3200 1175   Net -2178.3 -1175        Urine Occurrence 2 x    Stool Occurrence  2 x      ROS As above Blood pressure 138/75, pulse 100, temperature 98.4 F (36.9 C), temperature source Oral, resp. rate 18, height 5' 11 (1.803 m), weight 103.6 kg, SpO2 95%. Physical Exam  Gen: NAD, resting comfortably in bed, pleasant Lungs: unlabored, no increased work of breathing  Cardiac: reg  Left Lower Extremity   Knee immobilizer is in place  Ice placed over his knee  Moderate swelling to the anterior left knee.  No traumatic wounds, no open wounds  No significant swelling distally  DPN, SPN, TN sensory functions intact  EHL, FHL, lesser toe motor functions intact  Patient is able to perform a quad set without significant pain and he is also able to perform a straight leg raise without much difficulty  No DCT  Extremity is warm  + DP pulse   Assessment/Plan:  63 year old male ground-level fall with vertical left patella fracture  - Vertical left patella fracture  Exam is very promising.  Patient is able to form a straight leg raise and so his extensor is intact and not compromised  He can be weightbearing as tolerated with his knee in full extension.  Will likely begin some gentle motion after his first outpatient visit  Will get him a hinged knee brace as well.  He can use this instead of the knee immobilizer  but it needs to be locked in full extension when ambulatory otherwise he can continue to use his knee immobilizer   Bracing can be off when he is at rest   Continue with ice and elevation   - Pain management:  Minimize narcotics  Multimodal  - Dispo:  Stable Ortho issues  Follow-up with Dr. Josefina in 7-10 days  Given his occupation would expect him to be out of work for 8 weeks unless if light duty is available then this can be reviewed to see if we can get him back sooner   Jason MICAEL Mt, PA-C [phone] (C) 08/30/2024, 11:39 AM  [facility] [address] [place] [address] [phone] Jason Shepard (F)    After 5pm and on the weekends please log on to Amion, go to orthopaedics and the look under the [facility] Call for the provider(s) on call. You can also call our office at [phone] and then follow the prompts to be connected to the call team.

## 2024-08-30 NOTE — Progress Notes (Addendum)
 Informed by Ortho tech that the 15-20 mmHG graduated compression stockings will be delivered probably on  Monday.

## 2024-08-30 NOTE — Discharge Summary (Signed)
 Physician Discharge Summary   Patient: Jason Shepard. MRN: [id number] DOB: 1961/07/21  Admit date:     08/27/2024  Discharge date: 08/30/2024  Discharge Physician: Jason Marker, DO   PCP: Patient, No Pcp Per   Recommendations at discharge:   Follow-up with PCP within 1 to 2 weeks for repeat CBC, CMP, mag, Phos within 1 week Follow-up with orthopedic surgery Jason Shepard in 7 to 10 days: Continue with hinged brace and will being locked in full extension when ambulatory otherwise patient can continue to use his knee immobilizer Follow-up with pulmonary in outpatient setting within 3 to 5 weeks and repeat CT scan in 4 to 6 weeks  Discharge Diagnoses: Principal Problem:   Sepsis due to pneumonia Jason Shepard) Active Problems:   OBSTRUCTIVE SLEEP APNEA  Resolved Problems:   * No resolved Shepard problems. *  Shepard Course: Jason Shepard. is a 63 y.o. male with a history of OSA, hypertension.  Patient presented secondary to a fall with resultant knee pain and found to have comminuted nondisplaced vertical fractures of the lateral patella. Orthopedic surgery consulted and recommended non-surgical management with a knee immobilizer. During workup, patient was found to have hypoxia and CTA chest revealed a left upper lobe mass concerning for possible pneumonia, but could not rule out malignancy. Empiric antibiotics started. Pulmonology and orthopedic surgery consulted.  Pulmonary feels that the patient has a likely And recommends continuing CAP coverage for 5 to 7 days and repeating CT scan in 4 to 6 weeks.  Orthopedic surgery evaluated and ordered the patient a hinged knee brace and graduated compression socks.  He is deemed stable for discharge and will need to follow with PCP, orthopedic surgery and pulmonary in the outpatient setting within the coming weeks. Assessment and Plan:  Left patellar fracture Secondary to trip and fall at work. Imaging significant for a comminuted nondisplaced  fracture of the patella. Orthopedic surgery consulted and recommend no surgical management and a knee immobilizer however now orthopedics evaluated recommending weightbearing as tolerated with his knee in full extension and a hinged knee brace that is locked in full extension when ambulatory; patient also had 15 to 20 mmHg graduated compression stockings ordered for the patient. -PT/OT eval recommending a follow-up in orthopedic feels that he has stable Ortho issues and recommending outpatient close follow-up with Jason Shepard in 7 to 10 days   Left upper lobe mass Noted incidentally on CTA chest, which was significant for a 3.9 x 3.4 cm anterior left upper lobe mass concerning for pneumonia, but could not rule out malignancy. Empiric Ceftriaxone  and azithromycin started and will be continued while hospitalized and transition to p.o. - Pulmonary was consulted and feel this is likely likely a community-acquired pneumonia and recommending completion of antibiotics for 5 to 7 days.  Strep Ag negative.  Repeat x-ray showed also some atelectasis.  Pulmonary has cleared the patient for discharge and recommending repeating CT scan in 4 to 6 weeks and following with the pulmonary team in 2 to 6 weeks  Hypokalemia: Mild.  Patient's potassium is 2.4.  Replete with p.o. KCl 40 mEq twice daily x 2 doses.  Repeat CMP within 1 week   Acute respiratory failure with hypoxia, improved Patient with documented hypoxia with SpO2 down to 88%. Patient also reports dyspnea on exertion. 2 L/min of oxygen via nasal canula applied. -Weaned to room air -Ambulatory pulse ox prior to discharge and he did not desaturate as he was 94% - Repeat CT scan  in 4 to 6 weeks   AKI: Normal Cr at Baseline. Creatinine of 1.93 on admission. Resolved with IV fluids. -BUN/Cr Trend: Recent Labs  Lab 08/27/24 2020 08/29/24 0608 08/30/24 0921  BUN 38* 16 11  CREATININE 1.93* 0.86 0.87  -Avoid Nephrotoxic Medications, Contrast Dyes,  Hypotension and Dehydration to Ensure Adequate Renal Perfusion and will need to Renally Adjust Meds -Continue to Monitor and Trend Renal Function carefully and repeat CMP within 1 week   Normocytic Anemia: Hgb/Hct Trend:  Recent Labs  Lab 08/27/24 2020 08/29/24 0608 08/30/24 0921  HGB 12.2* 11.1* 11.6*  HCT 36.2* 33.1* 34.8*  MCV 88.3 89.9 88.1  -Check Anemia Panel in the outpatient setting. CTM for S/Sx of Bleeding; No overt bleeding noted. Repeat CBC within 1 week   OSA: Not on CPAP.   Prominent prevascular lymph nodes Noted on CT imaging measuring up to 8 mm. Indeterminate per radiology read. Will need attention on follow-up CT scan in 4 to 6 weeks   Primary hypertension:  Patient is on olmesartan-amlodipine as an outpatient which was held secondary to AKI. AKI resolved, but patient is currently normotensive with low-normal blood pressure. -Continue to hold antihypertensives and likely can resume at discharge at a lower dose   Hyperlipidemia: Resume home Crestor  Hypoalbuminemia: Patient's Albumin Lvl was 3.0. CTM and Trend and repeat CMP in the AM  Class I Obesity: Complicates overall prognosis and care. Estimated body mass index is 31.85 kg/m as calculated from the following:   Height as of this encounter: 5' 11 (1.803 m).   Weight as of this encounter: 103.6 kg. Weight Loss and Dietary Counseling given  Consultants: Orthopedic surgery, pulmonary Procedures performed: As delineated as above Disposition: Home Diet recommendation:  Discharge Diet Orders (From admission, onward)     Start     Ordered   08/30/24 0000  Diet - low sodium heart healthy        08/30/24 1429           Cardiac diet DISCHARGE MEDICATION: Allergies as of 08/30/2024   No Known Allergies      Medication List     STOP taking these medications    cyclobenzaprine  5 MG tablet Commonly known as: Flexeril    meloxicam  7.5 MG tablet Commonly known as: Mobic        TAKE these  medications    acetaminophen 325 MG tablet Commonly known as: TYLENOL Take 2 tablets (650 mg total) by mouth every 6 (six) hours as needed for mild pain (pain score 1-3) or fever (or Fever >/= 101).   azithromycin 500 MG tablet Commonly known as: Zithromax Take 1 tablet (500 mg total) by mouth daily for 5 days.   cefdinir 300 MG capsule Commonly known as: OMNICEF Take 1 capsule (300 mg total) by mouth 2 (two) times daily for 5 days.   diclofenac  sodium 1 % Gel Commonly known as: VOLTAREN  Apply 2 g topically 4 (four) times daily.   docusate sodium 100 MG capsule Commonly known as: COLACE Take 1 capsule (100 mg total) by mouth 2 (two) times daily.   finasteride 5 MG tablet Commonly known as: PROSCAR Take 5 mg by mouth daily.   ketorolac 10 MG tablet Commonly known as: TORADOL Take 1 tablet (10 mg total) by mouth every 6 (six) hours as needed.   methocarbamol 500 MG tablet Commonly known as: ROBAXIN Take 1-2 tablets (500-1,000 mg total) by mouth every 6 (six) hours as needed.   Olmesartan-amLODIPine-HCTZ 40-10-12.5 MG Tabs Take  1 tablet by mouth daily.   ondansetron 4 MG tablet Commonly known as: ZOFRAN Take 1 tablet (4 mg total) by mouth every 6 (six) hours as needed for nausea.   oxyCODONE-acetaminophen 5-325 MG tablet Commonly known as: PERCOCET/ROXICET Take 1-2 tablets by mouth every 8 (eight) hours as needed for moderate pain (pain score 4-6) or severe pain (pain score 7-10).   rosuvastatin 40 MG tablet Commonly known as: CRESTOR Take 40 mg by mouth daily.        Follow-up Information     Shepard Chew, MD Follow up in 1 week(s).   Specialty: Orthopedic Surgery Contact information: [address] [place] [address] [phone]                Discharge Exam: Fredricka Weights   08/27/24 1728 08/28/24 1737  Weight: 99.8 kg 103.6 kg   Vitals:   08/30/24 0900 08/30/24 1411  BP: 138/75 116/84  Pulse: 100 (!) 104  Resp:  18   Temp: 98.4 F (36.9 C) 98.7 F (37.1 C)  SpO2: 95% 94%   Examination: Physical Exam:  Constitutional: WN/WD obese Caucasian male no acute distress Respiratory: Mildly diminished to auscultation bilaterally, no wheezing, rales, rhonchi or crackles. Normal respiratory effort and patient is not tachypenic. No accessory muscle use.  Unlabored breathing Cardiovascular: RRR, no murmurs / rubs / gallops. S1 and S2 auscultated.  Lower extremity edema Abdomen: Soft, non-tender, distended secondary to body habitus. Bowel sounds positive.  GU: Deferred. Musculoskeletal: Wearing a knee immobilizer Skin: No rashes, lesions, ulcers limited skin evaluation. No induration; Warm and dry.  Neurologic: CN 2-12 grossly intact with no focal deficits.  Psychiatric: Normal judgment and insight. Alert and oriented x 3. Normal mood and appropriate affect.   Condition at discharge: stable  The results of significant diagnostics from this hospitalization (including imaging, microbiology, ancillary and laboratory) are listed below for reference.   Imaging Studies: DG CHEST PORT 1 VIEW Result Date: 08/30/2024 EXAM: 1 VIEW(S) XRAY OF THE CHEST 08/30/2024 11:39:00 AM COMPARISON: Plain film and CT of 08/27/2024. CLINICAL HISTORY: SOB (shortness of breath). FINDINGS: LIMITATIONS/ARTIFACTS: Limited exam secondary to patient body habitus and AP portable technique. LUNGS AND PLEURA: Left upper lobe pulmonary opacity is similar to minimally improved. Volume loss at both lung bases. Mild right hemidiaphragm elevation. No pleural effusion. No pneumothorax. HEART AND MEDIASTINUM: No acute abnormality of the cardiac and mediastinal silhouettes. BONES AND SOFT TISSUES: Cervical spine fixation. No acute osseous abnormality. IMPRESSION: 1. Left upper lobe pulmonary opacity, similar to minimally improved compared to prior study. As on CT, this is indeterminate but favored to represent infection 2. Limited exam due to portable AP  technique and patient body habitus. Electronically signed by: Rockey Kilts MD 08/30/2024 04:22 PM EST RP Workstation: [id number]   CT Angio Chest PE W and/or Wo Contrast Result Date: 08/27/2024 EXAM: CTA of the Chest with contrast for PE 08/27/2024 10:10:00 PM TECHNIQUE: CTA of the chest was performed after the administration of 75 mL of iohexol (OMNIPAQUE) 350 MG/ML injection. Multiplanar reformatted images are provided for review. MIP images are provided for review. Automated exposure control, iterative reconstruction, and/or weight based adjustment of the mA/kV was utilized to reduce the radiation dose to as low as reasonably achievable. COMPARISON: Comparison with same day x-ray. CLINICAL HISTORY: Pulmonary embolism (PE) suspected, high prob. Shortness of breath and hypoxia FINDINGS: PULMONARY ARTERIES: Pulmonary arteries are adequately opacified for evaluation. No pulmonary embolism. Main pulmonary artery is normal in caliber. MEDIASTINUM:  The heart and pericardium demonstrate no acute abnormality. There is no acute abnormality of the thoracic aorta. LYMPH NODES: Prominent prevascular lymph nodes measuring up to 8 mm in short axis (series 5 image 83). No hilar or axillary lymphadenopathy. LUNGS AND PLEURA: Bronchial wall thickening and mucus plugging in the lower lobes. Bibasilar scarring. 3.9 x 3.4 cm mass in the anterior left upper lobe corresponds with the abnormality on the same day chest radiograph. This may be due to pneumonia; however, malignancy is not excluded. Surrounding ground glass opacities. No pleural effusion or pneumothorax. UPPER ABDOMEN: Limited images of the upper abdomen are unremarkable. SOFT TISSUES AND BONES: No acute bone or soft tissue abnormality. IMPRESSION: 1. No evidence of pulmonary embolism. 2. 3.9 x 3.4 cm anterior left upper lobe mass with surrounding ground-glass opacities, which may represent pneumonia; however, malignancy is not excluded. Recommend prompt diagnostic  evaluation with short-interval follow-up chest CT (e.g., 46 weeks after appropriate therapy) versus PET/CT and/or tissue sampling based on clinical assessment. 3. Prominent prevascular lymph nodes up to 8 mm in short axis, indeterminate; continued attention on follow-up imaging. Electronically signed by: Norman Gatlin MD 08/27/2024 10:44 PM EST RP Workstation: [id number]   DG Chest Portable 1 View Result Date: 08/27/2024 CLINICAL DATA:  Short of breath, hypoxia EXAM: PORTABLE CHEST 1 VIEW COMPARISON:  06/02/2010 FINDINGS: Single frontal view of the chest demonstrates an unremarkable cardiac silhouette. There is a rounded area of consolidation in the left perihilar region in the left midlung zone, which could reflect focal bronchopneumonia. No effusion or pneumothorax. No acute bony abnormalities. IMPRESSION: 1. Focal area of rounded consolidation in the left mid lung zone, which could reflect bronchopneumonia. Followup PA and lateral chest X-ray is recommended in 3-4 weeks following trial of antibiotic therapy to ensure resolution and exclude underlying malignancy. Electronically Signed   By: Ozell Daring M.D.   On: 08/27/2024 20:47   CT Knee Left Wo Contrast Result Date: 08/27/2024 EXAM: CT LEFT KNEE, WITHOUT IV CONTRAST 08/27/2024 06:29:19 PM TECHNIQUE: Axial images were acquired through the left knee without IV contrast. Reformatted images were reviewed. Automated exposure control, iterative reconstruction, and/or weight based adjustment of the mA/kV was utilized to reduce the radiation dose to as low as reasonably achievable. COMPARISON: Same day knee x-ray. CLINICAL HISTORY: Knee trauma, tibial plateau fracture (Age >= 5y). FINDINGS: BONES: Comminuted nondisplaced vertical fractures of the lateral patella. Osseous loose body measuring 17 mm in the anterior lateral joint space. JOINTS: No dislocation. Small joint effusion. The joint spaces are normal. SOFT TISSUES: Prepatellar soft tissue swelling.  IMPRESSION: 1. Comminuted nondisplaced vertical fractures of the lateral patella. 2. Osseous loose body measuring 17 mm in the anterolateral joint space. 3. Small joint effusion and prepatellar soft tissue swelling. Electronically signed by: Norman Gatlin MD 08/27/2024 06:35 PM EST RP Workstation: [id number]   DG Knee 2 Views Left Result Date: 08/27/2024 EXAM: 1 or 2 VIEW(S) XRAY OF THE LEFT KNEE 08/27/2024 05:44:00 PM COMPARISON: None available. CLINICAL HISTORY: knee pain FINDINGS: BONES AND JOINTS: Curvilinear lucencies projecting over the lateral aspect of the patella and lateral femoral condyle. CT is recommended for further evaluation. No joint dislocation. Small joint effusion. Mild medial joint space narrowing. Possible 1.5 cm intra-articular body at anterior knee joint. SOFT TISSUES: Prepatellar soft tissue swelling. Vascular calcifications. IMPRESSION: 1. Curvilinear lucencies projecting over the lateral aspect of the patella and lateral femoral condyle; CT is recommended to exclude nondisplaced fracture. 2. Small joint effusion and prepatellar soft tissue swelling. 3. Possible  1.5 cm intra-articular body at the anterior knee joint. Electronically signed by: Norman Gatlin MD 08/27/2024 05:54 PM EST RP Workstation: [id number]   Microbiology: Results for orders placed or performed during the Shepard encounter of 06/08/10  Surgical pcr screen     Status: None   Collection Time: 06/02/10 11:02 AM  Result Value Ref Range Status   MRSA, PCR NEGATIVE NEGATIVE Final   Staphylococcus aureus  NEGATIVE Final    NEGATIVE        The Xpert SA Assay (FDA approved for NASAL specimens only), is one component of a comprehensive surveillance program.  It is not intended to diagnose infection nor to guide or monitor treatment.   Labs: CBC: Recent Labs  Lab 08/27/24 2020 08/29/24 0608 08/30/24 0921  WBC 15.6* 10.7* 9.9  NEUTROABS 12.8*  --  8.1*  HGB 12.2* 11.1* 11.6*  HCT 36.2* 33.1* 34.8*   MCV 88.3 89.9 88.1  PLT 370 320 344   Basic Metabolic Panel: Recent Labs  Lab 08/27/24 2020 08/29/24 0608 08/30/24 0921  NA 136 138 136  K 4.1 3.8 3.4*  CL 100 101 102  CO2 21* 25 23  GLUCOSE 130* 96 201*  BUN 38* 16 11  CREATININE 1.93* 0.86 0.87  CALCIUM 9.5 8.1* 8.2*  MG  --   --  1.9  PHOS  --   --  2.5   Liver Function Tests: Recent Labs  Lab 08/30/24 0921  AST 21  ALT 26  ALKPHOS 60  BILITOT 1.0  PROT 6.4*  ALBUMIN 3.0*   CBG: No results for input(s): GLUCAP in the last 168 hours.  Discharge time spent: greater than 30 minutes.  Signed: Alejandro Marker, DO [facility] 08/31/2024

## 2024-08-30 NOTE — Progress Notes (Signed)
 Orthopedic Tech Progress Note Patient Details:  Jason Shepard. July 08, 1961 [id number]  Stat order for ROM knee brace called into [facility]. I did specify this brace is to be locked in full extension.  Patient ID: Jason DELENA Marinell Mickey., male   DOB: 1961/04/23, 63 y.o.   MRN: [id number]  Jason Shepard 08/30/2024, 11:34 AM

## 2024-08-30 NOTE — Progress Notes (Signed)
 Orthopedic Tech Progress Note Patient Details:  [name] 01/18/1961 [id number]  Order for 15-20 mmHG graduated compression stockings called into [facility].  Patient ID: Jason Shepard Jason Mickey., male   DOB: September 26, 1961, 63 y.o.   MRN: [id number]  Tinnie Ronal Brasil 08/30/2024, 12:34 PM

## 2024-08-30 NOTE — Telephone Encounter (Signed)
 Please set up a follow up with me in 3-5 weeks. Thanks

## 2024-08-30 NOTE — Plan of Care (Signed)
  Problem: Education: Goal: Knowledge of General Education information will improve Description: Including pain rating scale, medication(s)/side effects and non-pharmacologic comfort measures Outcome: Adequate for Discharge   Problem: Pain Managment: Goal: General experience of comfort will improve and/or be controlled Outcome: Adequate for Discharge   Problem: Safety: Goal: Ability to remain free from injury will improve Outcome: Adequate for Discharge

## 2024-08-30 NOTE — Progress Notes (Signed)
 NAME:  Jason Shepard., MRN:  [id number], DOB:  08-21-1961, LOS: 2 ADMISSION DATE:  08/27/2024, CONSULTATION DATE:  08/30/2024  REFERRING MD:  Dr Norina DELENA Lam, CHIEF COMPLAINT:  lung mass   History of present illness    63 y.o. male with medical history significant for OSA not on CPAP who tripped at work and landed on his left knee.   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  He the swelling and pain got worse as the day weekday went on.  He presented to the emergency department because of his knee. While he was being evaluated it was noted that his sats dropped into the 80s.  The patient denied any shortness of breath. CT chest revealed  a 3.9 x 3.4 cm left upper lobe mass with surrounding ground glass opacities. Pulmonary is consulted for the same.  The patient was found to have a fracture of his patella and orthopedics was consulted   He is a non smoker. Denies personal hx of asthma but he thinks he may have developed it  Reports pain in knee  Interval hx: Remains on room air No complains except knee pain Couldn't tell if nebulizers helped Doing IS  Past Medical History  He,  has no past medical history on file.   Consults:  orthopedics  Procedures:    Significant Diagnostic Tests:  CT chest 08/2024 1. No evidence of pulmonary embolism. 2. 3.9 x 3.4 cm anterior left upper lobe mass with surrounding ground-glass opacities, which may represent pneumonia; however, malignancy is not excluded. Recommend prompt diagnostic evaluation with short-interval follow-up chest CT (e.g., 46 weeks after appropriate therapy) versus PET/CT and/or tissue sampling based on clinical assessment. 3. Prominent prevascular lymph nodes up to 8 mm in short axis, indeterminate; continued attention on follow-up imaging.   Micro Data:   Cultures pending  Antimicrobials:  Ceftriaxone +azithromycin  Objective   Blood pressure  138/75, pulse 100, temperature 98.4 F (36.9 C), temperature source Oral, resp. rate 18, height 5' 11 (1.803 m), weight 103.6 kg, SpO2 95%.    FiO2 (%):  [21 %] 21 %   Intake/Output Summary (Last 24 hours) at 08/30/2024 1203 Last data filed at 08/30/2024 1100 Gross per 24 hour  Intake 518.91 ml  Output 3675 ml  Net -3156.09 ml   Filed Weights   08/27/24 1728 08/28/24 1737  Weight: 99.8 kg 103.6 kg    Examination: Awake Conversant No distress No rales, no wheezing  Resolved Hospital Problem list     Assessment & Plan:  Left lung mass Likely CAP Acute hypoxic respi failure-resolved   - agree with empiric antibiotics - can complete 5-7 days of antibiotics at discharge - urine legionella pending and strep ag negative - given hypoxia at presentation, this is likely CAP +/- atelectasis. Repeat Xray still shows some opacity in the area. - no wheezing today - will set up pulm clinic follow up for repeat CT in 4-6 weeks  Please call us  with questions   Labs   CBC: Recent Labs  Lab 08/27/24 2020 08/29/24 0608 08/30/24 0921  WBC 15.6* 10.7* 9.9  NEUTROABS 12.8*  --  8.1*  HGB 12.2* 11.1* 11.6*  HCT 36.2* 33.1* 34.8*  MCV 88.3 89.9 88.1  PLT 370 320 344    Basic Metabolic Panel: Recent Labs  Lab 08/27/24 2020 08/29/24 0608 08/30/24 0921  NA 136 138 136  K 4.1  3.8 3.4*  CL 100 101 102  CO2 21* 25 23  GLUCOSE 130* 96 201*  BUN 38* 16 11  CREATININE 1.93* 0.86 0.87  CALCIUM 9.5 8.1* 8.2*  MG  --   --  1.9  PHOS  --   --  2.5   GFR: Estimated Creatinine Clearance: 109.2 mL/min (by C-G formula based on SCr of 0.87 mg/dL). Recent Labs  Lab 08/27/24 2020 08/29/24 0608 08/30/24 0921  WBC 15.6* 10.7* 9.9    Liver Function Tests: Recent Labs  Lab 08/30/24 0921  AST 21  ALT 26  ALKPHOS 60  BILITOT 1.0  PROT 6.4*  ALBUMIN 3.0*   No results for input(s): LIPASE, AMYLASE in the last 168 hours. No results for input(s): AMMONIA in the last 168  hours.  ABG No results found for: PHART, PCO2ART, PO2ART, HCO3, TCO2, ACIDBASEDEF, O2SAT   Coagulation Profile: No results for input(s): INR, PROTIME in the last 168 hours.  Cardiac Enzymes: No results for input(s): CKTOTAL, CKMB, CKMBINDEX, TROPONINI in the last 168 hours.  HbA1C: No results found for: HGBA1C  CBG: No results for input(s): GLUCAP in the last 168 hours.  Review of Systems:   Denies shortness of breath , hemoptysis  Surgical History   No past surgical history on file.   Social History   reports that he has never smoked. He does not have any smokeless tobacco history on file. He reports that he does not drink alcohol and does not use drugs.   Family History   His family history is not on file.   Allergies No Known Allergies   Home Medications  Prior to Admission medications   Medication Sig Start Date End Date Taking? Authorizing Provider  finasteride (PROSCAR) 5 MG tablet Take 5 mg by mouth daily. 08/06/24  Yes [provider]  Olmesartan-amLODIPine-HCTZ 40-10-12.5 MG TABS Take 1 tablet by mouth daily. 06/24/24  Yes [provider]  rosuvastatin (CRESTOR) 40 MG tablet Take 40 mg by mouth daily. 06/06/21  Yes [provider]  cyclobenzaprine  (FLEXERIL ) 5 MG tablet One tablet every 8 hours when at home for muscle spasms. Will cause drowsiness 07/19/15   Tharon Lenis, NP  diclofenac  sodium (VOLTAREN ) 1 % GEL Apply 2 g topically 4 (four) times daily. Patient not taking: Reported on 08/28/2024 04/22/22   Lenor Hollering, MD  meloxicam  (MOBIC ) 7.5 MG tablet Take 1 tablet (7.5 mg total) by mouth daily. Patient not taking: Reported on 08/28/2024 04/22/22   Lenor Hollering, MD     Critical care time n/a    Tyeasha Ebbs [facility] 08/30/2024 12:03 PM  Pager: see amion  If no response to pager , please call critical care on call (see AMION) until 7pm After 7:00 pm call Elink

## 2024-08-30 NOTE — Plan of Care (Signed)

## 2024-08-30 NOTE — Discharge Instructions (Signed)
 Orthopedic discharge instructions  Weightbearing as tolerated left leg with knee in full extension.  You can either wear your knee immobilizer or the hinged knee brace locked in full extension  Okay to have the brace open when at rest to enable you to get ice on your knee.  Knee immobilizer or brace needs to be in place when ambulating  Use walker or crutches to help with ambulation  Continue with ice and elevation to help with swelling control.  Move your toes and ankle is much as possible to help with swelling as well  Follow-up with Dr. Josefina at [facility] orthopedics in 7 to 10 days

## 2024-09-01 LAB — LEGIONELLA PNEUMOPHILA SEROGP 1 UR AG: L. pneumophila Serogp 1 Ur Ag: NEGATIVE

## 2024-09-19 ENCOUNTER — Emergency Department (HOSPITAL_COMMUNITY)
Admission: EM | Admit: 2024-09-19 | Discharge: 2024-09-20 | Disposition: A | Discharge status: Home / Self Care | Attending: Emergency Medicine | Admitting: Emergency Medicine

## 2024-09-19 ENCOUNTER — Emergency Department (HOSPITAL_COMMUNITY)

## 2024-09-19 ENCOUNTER — Other Ambulatory Visit: Payer: Self-pay

## 2024-09-19 DIAGNOSIS — S61251A Open bite of left index finger without damage to nail, initial encounter: Secondary | ICD-10-CM | POA: Diagnosis not present

## 2024-09-19 DIAGNOSIS — Z79899 Other long term (current) drug therapy: Secondary | ICD-10-CM | POA: Insufficient documentation

## 2024-09-19 DIAGNOSIS — W503XXA Accidental bite by another person, initial encounter: Secondary | ICD-10-CM

## 2024-09-19 DIAGNOSIS — Z23 Encounter for immunization: Secondary | ICD-10-CM | POA: Diagnosis not present

## 2024-09-19 DIAGNOSIS — Y92019 Unspecified place in single-family (private) house as the place of occurrence of the external cause: Secondary | ICD-10-CM | POA: Insufficient documentation

## 2024-09-19 DIAGNOSIS — S0181XA Laceration without foreign body of other part of head, initial encounter: Secondary | ICD-10-CM | POA: Diagnosis not present

## 2024-09-19 DIAGNOSIS — S6992XA Unspecified injury of left wrist, hand and finger(s), initial encounter: Secondary | ICD-10-CM | POA: Diagnosis present

## 2024-09-19 MED ORDER — TETANUS-DIPHTH-ACELL PERTUSSIS 5-2-15.5 LF-MCG/0.5 IM SUSP
0.5000 mL | Freq: Once | INTRAMUSCULAR | Status: AC
  Administered 2024-09-19: 0.5 mL via INTRAMUSCULAR
  Filled 2024-09-19: qty 0.5

## 2024-09-19 MED ORDER — LIDOCAINE-EPINEPHRINE-TETRACAINE (LET) TOPICAL GEL
3.0000 mL | Freq: Once | TOPICAL | Status: AC
  Administered 2024-09-20: 3 mL via TOPICAL
  Filled 2024-09-19: qty 3

## 2024-09-19 NOTE — ED Notes (Signed)
 Patient transported to CT
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
## 2024-09-20 MED ORDER — ACETAMINOPHEN 500 MG PO TABS
1000.0000 mg | ORAL_TABLET | Freq: Once | ORAL | Status: AC
  Administered 2024-09-20: 1000 mg via ORAL
  Filled 2024-09-20: qty 2

## 2024-09-20 MED ORDER — MUPIROCIN CALCIUM 2 % EX CREA: 1.0000 | TOPICAL_CREAM | Freq: Two times a day (BID) | CUTANEOUS | 0 refills | Status: AC

## 2024-09-20 MED ORDER — AMOXICILLIN-POT CLAVULANATE 875-125 MG PO TABS: 1.0000 | ORAL_TABLET | Freq: Two times a day (BID) | ORAL | 0 refills | Status: AC

## 2024-09-20 MED ORDER — AMOXICILLIN-POT CLAVULANATE 875-125 MG PO TABS
1.0000 | ORAL_TABLET | Freq: Once | ORAL | Status: AC
  Administered 2024-09-20: 1 via ORAL
  Filled 2024-09-20: qty 1

## 2024-09-20 MED ORDER — KETOROLAC TROMETHAMINE 60 MG/2ML IM SOLN: 30.0000 mg | Freq: Once | INTRAMUSCULAR | Status: DC

## 2024-09-20 MED ORDER — KETOROLAC TROMETHAMINE 15 MG/ML IJ SOLN
15.0000 mg | Freq: Once | INTRAMUSCULAR | Status: AC
  Administered 2024-09-20: 15 mg via INTRAVENOUS
  Filled 2024-09-20: qty 1

## 2024-09-20 MED ORDER — NAPROXEN 500 MG PO TABS: 500.0000 mg | ORAL_TABLET | Freq: Two times a day (BID) | ORAL | 0 refills | Status: AC
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
## 2024-09-30 ENCOUNTER — Ambulatory Visit

## 2024-09-30 VITALS — BP 126/72 | HR 106 | Temp 97.8°F | Ht 71.0 in | Wt 217.8 lb

## 2024-09-30 DIAGNOSIS — R918 Other nonspecific abnormal finding of lung field: Secondary | ICD-10-CM | POA: Diagnosis not present

## 2024-09-30 DIAGNOSIS — J9811 Atelectasis: Secondary | ICD-10-CM

## 2024-09-30 NOTE — Patient Instructions (Signed)
 It was a pleasure to see you today. We will get a chest xray today and forward the results to you. Your will receive a call for your CT chest scheduling in next couple of weeks

## 2024-09-30 NOTE — Progress Notes (Signed)
**Note Jason-Identified via Obfuscation**  NAME:  Jason Shepard., MRN:  [id number], DOB:  1961/03/17,   History of present illness    63 y.o. male with medical history significant for OSA not on CPAP who tripped at work and landed on his left knee.   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  He the swelling and pain got worse as the day weekday went on.  He presented to the emergency department because of his knee. While he was being evaluated it was noted that his sats dropped into the 80s.  The patient denied any shortness of breath. CT chest revealed  a 3.9 x 3.4 cm left upper lobe mass with surrounding ground glass opacities. Pulmonary is consulted for the same.  The patient was found to have a fracture of his patella and is undergoing non surgical treatment at this time. He is here for follow up of the lung lesion  He is a non smoker. Denies personal hx of asthma but he thinks he may have developed it  He was discharged home on antibiotics Denies any cough, left pain, wheezing or shortness of breath     Significant Diagnostic Tests:  CT chest 08/2024 1. No evidence of pulmonary embolism. 2. 3.9 x 3.4 cm anterior left upper lobe mass with surrounding ground-glass opacities, which may represent pneumonia; however, malignancy is not excluded. Recommend prompt diagnostic evaluation with short-interval follow-up chest CT (e.g., 46 weeks after appropriate therapy) versus PET/CT and/or tissue sampling based on clinical assessment. 3. Prominent prevascular lymph nodes up to 8 mm in short axis, indeterminate; continued attention on follow-up imaging.  Reviewed inpatient admission, consult and d/c notes   Objective   Blood pressure 126/72, pulse (!) 106, temperature 97.8 F (36.6 C), temperature source Oral, height 5' 11 (1.803 m), weight 217 lb 12.8 oz (98.8 kg), SpO2 92%.        Intake/Output Summary (Last 24 hours) at 08/30/2024 1203 Last data filed at  08/30/2024 1100 Gross per 24 hour  Intake 518.91 ml  Output 3675 ml  Net -3156.09 ml   Filed Weights   09/30/24 1357  Weight: 217 lb 12.8 oz (98.8 kg)    Examination: Physical Exam Constitutional:      Appearance: Normal appearance.  HENT:     Head: Normocephalic.     Mouth/Throat:     Mouth: Mucous membranes are moist.  Cardiovascular:     Rate and Rhythm: Normal rate.  Pulmonary:     Effort: No respiratory distress.     Breath sounds: Normal breath sounds. No stridor.  Abdominal:     General: Abdomen is flat.  Musculoskeletal:     Comments: Left leg on braces  Skin:    General: Skin is warm.  Neurological:     Mental Status: He is alert and oriented to person, place, and time.      Resolved Hospital Problem list     Assessment & Plan:  Left lung mass Post hospital admission follow up   Repeat chest xray today. In the hospital follow up xray had shown some improvement If non concerning, will obtain ct chest in 2 weeks (6-8 weeks since ed visit) to ensure complete resolution of the lesion.    Cecilio Ohlrich [facility] 09/30/2024 2:28 PM  Pager: see amion  If no response to pager , please call critical care on call (see AMION) until 7pm After  7:00 pm call Elink

## 2024-10-07 ENCOUNTER — Telehealth: Payer: Self-pay

## 2024-10-07 NOTE — Telephone Encounter (Signed)
 Pt has been rescheduled and made aware. NFN

## 2024-10-07 NOTE — Telephone Encounter (Signed)
 Chest xray from yesterday looks good, the opacity is improving, not clearly visible.  CT chest was meant to be after 2 weeks (probably order didn't reflect that). Can we please move his CT chest to 2-3 weeks from tomorrow? (To give enough time for any residual opacity to clear up completely)

## 2024-10-08 ENCOUNTER — Other Ambulatory Visit

## 2024-10-30 ENCOUNTER — Ambulatory Visit: Admission: RE | Admit: 2024-10-30 | Discharge: 2024-10-30 | Disposition: A | Source: Ambulatory Visit

## 2024-10-30 DIAGNOSIS — R918 Other nonspecific abnormal finding of lung field: Secondary | ICD-10-CM

## 2024-10-30 DIAGNOSIS — J9811 Atelectasis: Secondary | ICD-10-CM

## 2024-11-10 ENCOUNTER — Ambulatory Visit: Payer: Self-pay

## 2025-01-08 ENCOUNTER — Ambulatory Visit
